# Patient Record
Sex: Male | Born: 1940 | Race: White | Hispanic: No | Marital: Married | Smoking: Never smoker
Health system: Southern US, Community
[De-identification: ages and names within clinical notes are randomized; demographics above are authoritative.]

## PROBLEM LIST (undated history)

## (undated) DIAGNOSIS — K219 Gastro-esophageal reflux disease without esophagitis: Secondary | ICD-10-CM

## (undated) DIAGNOSIS — F419 Anxiety disorder, unspecified: Secondary | ICD-10-CM

## (undated) DIAGNOSIS — K222 Esophageal obstruction: Secondary | ICD-10-CM

## (undated) DIAGNOSIS — G2581 Restless legs syndrome: Secondary | ICD-10-CM

## (undated) DIAGNOSIS — K4090 Unilateral inguinal hernia, without obstruction or gangrene, not specified as recurrent: Secondary | ICD-10-CM

## (undated) DIAGNOSIS — T7840XA Allergy, unspecified, initial encounter: Secondary | ICD-10-CM

## (undated) DIAGNOSIS — K602 Anal fissure, unspecified: Secondary | ICD-10-CM

## (undated) DIAGNOSIS — K5732 Diverticulitis of large intestine without perforation or abscess without bleeding: Secondary | ICD-10-CM

## (undated) DIAGNOSIS — C61 Malignant neoplasm of prostate: Secondary | ICD-10-CM

## (undated) DIAGNOSIS — C801 Malignant (primary) neoplasm, unspecified: Secondary | ICD-10-CM

## (undated) DIAGNOSIS — H8109 Meniere's disease, unspecified ear: Secondary | ICD-10-CM

## (undated) HISTORY — DX: Malignant neoplasm of prostate: C61

## (undated) HISTORY — PX: HERNIA REPAIR: SHX51

## (undated) HISTORY — DX: Anxiety disorder, unspecified: F41.9

## (undated) HISTORY — DX: Allergy, unspecified, initial encounter: T78.40XA

---

## 2000-08-03 HISTORY — PX: PROSTATECTOMY: SHX69

## 2002-08-03 HISTORY — PX: ANAL FISSURE REPAIR: SHX2312

## 2008-04-13 ENCOUNTER — Emergency Department: Payer: Self-pay | Admitting: Emergency Medicine

## 2009-05-06 ENCOUNTER — Ambulatory Visit: Payer: Self-pay | Admitting: Internal Medicine

## 2009-12-23 ENCOUNTER — Ambulatory Visit: Payer: Self-pay | Admitting: Unknown Physician Specialty

## 2011-06-11 ENCOUNTER — Ambulatory Visit: Payer: Self-pay | Admitting: Internal Medicine

## 2012-08-18 ENCOUNTER — Ambulatory Visit: Payer: Self-pay | Admitting: Unknown Physician Specialty

## 2012-08-18 ENCOUNTER — Inpatient Hospital Stay: Payer: Self-pay | Admitting: Internal Medicine

## 2012-08-18 LAB — BASIC METABOLIC PANEL
BUN: 11 mg/dL (ref 7–18)
Calcium, Total: 8.7 mg/dL (ref 8.5–10.1)
Chloride: 102 mmol/L (ref 98–107)
Co2: 27 mmol/L (ref 21–32)
Creatinine: 0.94 mg/dL (ref 0.60–1.30)
EGFR (African American): >60
Glucose: 170 mg/dL — ABNORMAL HIGH (ref 65–99)
Potassium: 3.5 mmol/L (ref 3.5–5.1)
Sodium: 136 mmol/L (ref 136–145)

## 2012-08-18 LAB — CBC WITH DIFFERENTIAL/PLATELET
Basophil #: 0.1 10*3/uL (ref 0.0–0.1)
Basophil %: 1.2 %
Eosinophil %: 0.1 %
HCT: 45.8 % (ref 40.0–52.0)
HGB: 14.5 g/dL (ref 13.0–18.0)
Lymphocyte %: 2.1 %
MCH: 27.6 pg (ref 26.0–34.0)
MCHC: 31.7 g/dL — ABNORMAL LOW (ref 32.0–36.0)
MCV: 87 fL (ref 80–100)
Monocyte #: 0.5 x10 3/mm (ref 0.2–1.0)
Monocyte %: 4.2 %
Neutrophil #: 11.2 10*3/uL — ABNORMAL HIGH (ref 1.4–6.5)
Neutrophil %: 92.4 %
RBC: 5.25 10*6/uL (ref 4.40–5.90)
RDW: 13 % (ref 11.5–14.5)
WBC: 12.1 10*3/uL — ABNORMAL HIGH (ref 3.8–10.6)

## 2012-08-18 LAB — URINALYSIS, COMPLETE
Bilirubin,UR: NEGATIVE
Blood: NEGATIVE
Glucose,UR: NEGATIVE mg/dL (ref 0–75)
Ketone: NEGATIVE
Leukocyte Esterase: NEGATIVE
RBC,UR: NONE SEEN /HPF (ref 0–5)
Specific Gravity: 1.016 (ref 1.003–1.030)

## 2012-08-18 LAB — APTT: Activated PTT: 26.9 secs (ref 23.6–35.9)

## 2012-08-18 LAB — HEPATIC FUNCTION PANEL A (ARMC)
Bilirubin,Total: 0.8 mg/dL (ref 0.2–1.0)
SGOT(AST): 25 U/L (ref 15–37)
SGPT (ALT): 28 U/L (ref 12–78)
Total Protein: 7.4 g/dL (ref 6.4–8.2)

## 2012-08-18 LAB — PROTIME-INR
INR: 0.9
Prothrombin Time: 13 secs (ref 11.5–14.7)

## 2012-08-19 LAB — PATHOLOGY REPORT

## 2012-08-21 LAB — EXPECTORATED SPUTUM ASSESSMENT W GRAM STAIN, RFLX TO RESP C

## 2012-08-24 LAB — CULTURE, BLOOD (SINGLE)

## 2014-05-02 ENCOUNTER — Ambulatory Visit: Payer: Self-pay | Admitting: Otolaryngology

## 2014-11-23 NOTE — H&P (Signed)
PATIENT NAME:  Frank Gonzalez, Frank Gonzalez MR#:  [id number] DATE OF BIRTH:  1941-01-02  DATE OF ADMISSION:  08/18/2012  PRIMARY CARE PHYSICIAN: Rusty Aus, MD  CHIEF COMPLAINT: Cough, fever and chills.   HISTORY OF PRESENT ILLNESS: This is a 74 year old man who came in today for an elective colonoscopy by Dr. Vira Agar. He felt well prior to the procedure. He had a couple of polyps removed. He was coughing during the procedure and probably aspirated. Since his oxygen level and vitals were okay prior to discharge, he was discharged home. On the way home, he was having some chills, developed a fever. He was nauseated. He was having cough, light phlegm, and some chest discomfort with the cough. He came back to the endoscopy suite and was set up for a direct admission for aspiration pneumonia.   PAST MEDICAL HISTORY: Gastroesophageal reflux disease, constipation, history of prostate cancer, restless leg syndrome, anxiety, depression.   PAST SURGICAL HISTORY: Prostatectomy, hernia surgery, rectal anal surgery x 2.   ALLERGIES: No known drug allergies but does have constipation to ranitidine.  MEDICATIONS: As the patient states are Mirapex 1 mg at bedtime, Zoloft 25 mg b.i.d., multivitamin daily, magnesium supplementation, omega-3 daily, 50+ vitamin, ranitidine and TUMS.   SOCIAL HISTORY: No smoking. No alcohol. No drug use. 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   FAMILY HISTORY: Father died of Parkinson's. Mother died at age 50 after childbirth. Three sisters, 1 with ovarian cancer.   REVIEW OF SYSTEMS:  CONSTITUTIONAL: Positive for fever. Positive for chills. Positive for sweating. Positive for fatigue.  EYES: He does wear glasses.  EARS, NOSE, MOUTH AND THROAT: Positive for sore throat. No difficulty swallowing.  CARDIOVASCULAR: Positive for chest pain with cough.  RESPIRATORY: No shortness of breath. Positive for cough, light-colored phlegm. No hemoptysis.  GASTROINTESTINAL:  Positive for nausea. No vomiting. No abdominal pain. No diarrhea. No constipation. No bright red blood per rectum. No melena.  GENITOURINARY: No burning on urination. No hematuria.  MUSCULOSKELETAL: Positive for joint pain.  NEUROLOGIC: No fainting or blackouts.  PSYCHIATRIC: Positive for anxiety and depression.  ENDOCRINE: No thyroid problems.  HEMATOLOGIC, LYMPHATIC: No anemia. No easy bruising or bleeding.   PHYSICAL EXAMINATION:  VITAL SIGNS: Temperature 98.3, pulse 116, respirations 20, blood pressure 128/76, pulse ox 98% on 2 liters.  GENERAL: No respiratory distress, but coughs with deep breath. EYES: Conjunctivae and lids normal. Pupils equal, round and reactive to light. Extraocular muscles intact. No nystagmus.  EARS, NOSE, MOUTH AND THROAT: Tympanic membranes: No erythema. Nasal mucosa: No erythema. Throat: No erythema, no exudate seen. Lips and gums: No lesions.  NECK: No JVD. No bruits. No lymphadenopathy. No thyromegaly. No thyroid nodules palpated.  RESPIRATORY: No use of accessory muscles to breathe. Coughs with deep breath. Positive rhonchi bilateral bases, left greater than right.  CARDIOVASCULAR: S1, S2 tachycardic. No gallops, rubs or murmurs heard. Carotid upstroke 2+ bilaterally, no bruits.  EXTREMITIES: Dorsalis pedis pulses 2+ bilaterally. No edema of the lower extremities.  ABDOMEN: Soft, nontender. No organosplenomegaly. Normoactive bowel sounds. No masses felt.  LYMPHATIC: No lymph nodes in the neck.  MUSCULOSKELETAL: No clubbing, edema or cyanosis.  SKIN: No rashes or ulcers seen.  NEUROLOGIC: Cranial nerves II through XII grossly intact. Deep tendon reflexes 2+ bilateral lower extremities.  PSYCHIATRIC: The patient is oriented to person, place and time.   LABORATORY, DIAGNOSTIC AND RADIOLOGICAL DATA: Blood cultures x 2, CBC, BMP, PT and hepatic panel ordered. Chest x-ray PA  and lateral ordered and an EKG ordered.   ASSESSMENT AND PLAN:  1.  Aspiration pneumonia  with tachycardia: Will get blood cultures x 2, admission labs. Need to watch temperature curve. Empiric Zosyn started. Will also send off a sputum culture and get the chest x-ray.  2.  Anxiety and depression: On Zoloft.  3.  Restless leg syndrome: On Mirapex.  4.  History of prostate cancer treated with prostatectomy.  5.  Status post polyp removal. Continue to monitor closely. Dr. Vira Agar to follow while in the hospital.   TIME SPENT ON ADMISSION: 55 minutes.   CODE STATUS: The patient is a full code.   ____________________________ Tana Conch. Leslye Peer, MD rjw:jm D: 08/18/2012 17:34:50 ET T: 08/18/2012 18:17:45 ET JOB#: [id number]  cc: Tana Conch. Leslye Peer, MD, <Dictator> Rusty Aus, MD Marisue Brooklyn MD ELECTRONICALLY SIGNED 08/18/2012 20:18

## 2014-11-23 NOTE — Discharge Summary (Signed)
PATIENT NAME:  Frank Gonzalez, Frank Gonzalez MR#:  [id number] DATE OF BIRTH:  [birth year]  DATE OF ADMISSION:  08/18/2012 DATE OF DISCHARGE:  08/19/2012  DISCHARGE DIAGNOSES: 1. Aspiration pneumonitis.  2. Restless leg syndrome.  3. Gastroesophageal reflux disease.   DISCHARGE MEDICATIONS: 1. Zoloft 25 mg daily.  2. Mirapex 0.5 mg daily.  3. Advair 100/50 2 times a day. 4. Levaquin 500 mg daily for 10 days.  5. Ranitidine 300 mg twice daily.   REASON FOR ADMISSION: A 74 year old who presents with left lung aspiration pneumonitis post colonoscopy. Please see H and P for HPI, past medical history and physical exam.   HOSPITAL COURSE: The patient was admitted with chest x-ray showing inflammation of the left lung, but no clear infiltrate. He received IV Zosyn. His fever went away. His cough improved. He came off oxygen, 95% on room air. He will be instructed in the use of Advair. He will be switched over to Levaquin and continue that for 10 days. His reflux was controlled with 300 mg ranitidine. Follow up with Dr. Sabra Heck mid next week. His prognosis is good.  ____________________________ Rusty Aus, MD mfm:sb D: 08/19/2012 08:21:04 ET T: 08/19/2012 08:39:42 ET JOB#: [id number]  cc: Rusty Aus, MD, <Dictator> Rusty Aus MD ELECTRONICALLY SIGNED 08/22/2012 8:01

## 2015-08-23 DIAGNOSIS — Z86010 Personal history of colonic polyps: Secondary | ICD-10-CM | POA: Diagnosis not present

## 2015-09-24 ENCOUNTER — Encounter: Payer: Self-pay | Admitting: *Deleted

## 2015-09-25 ENCOUNTER — Ambulatory Visit: Payer: PPO | Admitting: Anesthesiology

## 2015-09-25 ENCOUNTER — Encounter
Admission: RE | Disposition: A | Discharge status: Home / Self Care | Payer: Self-pay | Source: Ambulatory Visit | Attending: Unknown Physician Specialty

## 2015-09-25 ENCOUNTER — Encounter: Payer: Self-pay | Admitting: Anesthesiology

## 2015-09-25 ENCOUNTER — Ambulatory Visit
Admission: RE | Admit: 2015-09-25 | Discharge: 2015-09-25 | Disposition: A | Discharge status: Home / Self Care | Payer: PPO | Source: Ambulatory Visit | Attending: Unknown Physician Specialty | Admitting: Unknown Physician Specialty

## 2015-09-25 DIAGNOSIS — Z09 Encounter for follow-up examination after completed treatment for conditions other than malignant neoplasm: Secondary | ICD-10-CM | POA: Insufficient documentation

## 2015-09-25 DIAGNOSIS — Z79899 Other long term (current) drug therapy: Secondary | ICD-10-CM | POA: Insufficient documentation

## 2015-09-25 DIAGNOSIS — Z86010 Personal history of colonic polyps: Secondary | ICD-10-CM | POA: Diagnosis not present

## 2015-09-25 DIAGNOSIS — Z9079 Acquired absence of other genital organ(s): Secondary | ICD-10-CM | POA: Diagnosis not present

## 2015-09-25 DIAGNOSIS — K648 Other hemorrhoids: Secondary | ICD-10-CM | POA: Diagnosis not present

## 2015-09-25 DIAGNOSIS — Z8546 Personal history of malignant neoplasm of prostate: Secondary | ICD-10-CM | POA: Insufficient documentation

## 2015-09-25 DIAGNOSIS — Z9889 Other specified postprocedural states: Secondary | ICD-10-CM | POA: Insufficient documentation

## 2015-09-25 DIAGNOSIS — K219 Gastro-esophageal reflux disease without esophagitis: Secondary | ICD-10-CM | POA: Insufficient documentation

## 2015-09-25 DIAGNOSIS — G2581 Restless legs syndrome: Secondary | ICD-10-CM | POA: Diagnosis not present

## 2015-09-25 DIAGNOSIS — K640 First degree hemorrhoids: Secondary | ICD-10-CM | POA: Diagnosis not present

## 2015-09-25 HISTORY — DX: Meniere's disease, unspecified ear: H81.09

## 2015-09-25 HISTORY — DX: Anal fissure, unspecified: K60.2

## 2015-09-25 HISTORY — DX: Malignant (primary) neoplasm, unspecified: C80.1

## 2015-09-25 HISTORY — DX: Gastro-esophageal reflux disease without esophagitis: K21.9

## 2015-09-25 HISTORY — PX: COLONOSCOPY WITH PROPOFOL: SHX5780

## 2015-09-25 HISTORY — DX: Diverticulitis of large intestine without perforation or abscess without bleeding: K57.32

## 2015-09-25 HISTORY — DX: Esophageal obstruction: K22.2

## 2015-09-25 HISTORY — DX: Restless legs syndrome: G25.81

## 2015-09-25 HISTORY — DX: Unilateral inguinal hernia, without obstruction or gangrene, not specified as recurrent: K40.90

## 2015-09-25 SURGERY — COLONOSCOPY WITH PROPOFOL
Anesthesia: General

## 2015-09-25 MED ORDER — METOCLOPRAMIDE HCL 5 MG/ML IJ SOLN
INTRAMUSCULAR | Status: DC | PRN
  Administered 2015-09-25: 10 mg via INTRAVENOUS

## 2015-09-25 MED ORDER — SODIUM CHLORIDE 0.9 % IV SOLN
INTRAVENOUS | Status: DC
  Administered 2015-09-25: 1000 mL via INTRAVENOUS

## 2015-09-25 MED ORDER — MIDAZOLAM HCL 2 MG/2ML IJ SOLN
INTRAMUSCULAR | Status: DC | PRN
  Administered 2015-09-25: 1 mg via INTRAVENOUS

## 2015-09-25 MED ORDER — FENTANYL CITRATE (PF) 100 MCG/2ML IJ SOLN
INTRAMUSCULAR | Status: DC | PRN
Start: 2015-09-25 — End: 2015-09-25
  Administered 2015-09-25: 50 ug via INTRAVENOUS

## 2015-09-25 MED ORDER — SODIUM CHLORIDE 0.9 % IV SOLN: INTRAVENOUS | Status: DC

## 2015-09-25 MED ORDER — PROPOFOL 500 MG/50ML IV EMUL
INTRAVENOUS | Status: DC | PRN
  Administered 2015-09-25: 75 ug/kg/min via INTRAVENOUS

## 2015-09-25 MED ORDER — PROPOFOL 10 MG/ML IV BOLUS
INTRAVENOUS | Status: DC | PRN
  Administered 2015-09-25: 10 mg via INTRAVENOUS
  Administered 2015-09-25: 20 mg via INTRAVENOUS

## 2015-09-25 NOTE — H&P (Signed)
Primary Care Physician:  Rusty Aus., MD Primary Gastroenterologist:  Dr. Vira Agar  Pre-Procedure History & Physical: HPI:  Frank Gonzalez is a 75 y.o. male is here for an colonoscopy.   Past Medical History  Diagnosis Date  . Anal fissure   . Diverticulitis large intestine   . GERD (gastroesophageal reflux disease)   . Inguinal hernia   . Meniere disease   . Cancer [facility])     prostate  . RLS (restless legs syndrome)   . Schatzki's ring     Past Surgical History  Procedure Laterality Date  . Hernia repair    . Prostatectomy  2002  . Anal fissure repair  2004    Prior to Admission medications   Medication Sig Start Date End Date Taking? Authorizing Provider  beta carotene w/minerals (OCUVITE) tablet Take 1 tablet by mouth daily.   Yes Historical Provider, MD  Cholecalciferol (VITAMIN D3) 3000 units TABS Take 2,000 Units by mouth daily.   Yes Historical Provider, MD  Multiple Vitamin (MULTIVITAMIN) tablet Take 1 tablet by mouth daily.   Yes Historical Provider, MD  olopatadine (PATANOL) 0.1 % ophthalmic solution Place 2 drops into both eyes 2 (two) times daily.   Yes Historical Provider, MD  omega-3 acid ethyl esters (LOVAZA) 1 g capsule Take 1,200 mg by mouth daily.   Yes Historical Provider, MD  pantoprazole (PROTONIX) 40 MG tablet Take 40 mg by mouth daily.   Yes Historical Provider, MD  polycarbophil (FIBERCON) 625 MG tablet Take 625 mg by mouth daily.   Yes Historical Provider, MD  pramipexole (MIRAPEX) 1 MG tablet Take 1 mg by mouth daily.   Yes Historical Provider, MD  senna-docusate (SENOKOT-S) 8.6-50 MG tablet Take 1 tablet by mouth daily.   Yes Historical Provider, MD  sertraline (ZOLOFT) 50 MG tablet Take 50 mg by mouth daily.   Yes Historical Provider, MD  Specialty Vitamins Products (MAGNESIUM, AMINO ACID CHELATE,) 133 MG tablet Take 1 tablet by mouth 2 (two) times daily.   Yes Historical Provider, MD  triamcinolone (NASACORT ALLERGY 24HR) 55 MCG/ACT AERO nasal  inhaler Place 2 sprays into the nose daily.   Yes Historical Provider, MD    Allergies as of 09/03/2015  . (Not on File)    History reviewed. No pertinent family history.  Social History   Social History  . Marital Status: Married    Spouse Name: N/A  . Number of Children: N/A  . Years of Education: N/A   Occupational History  . Not on file.   Social History Main Topics  . Smoking status: Never Smoker   . Smokeless tobacco: Never Used  . Alcohol Use: No  . Drug Use: No  . Sexual Activity: Not on file   Other Topics Concern  . Not on file   Social History Narrative    Review of Systems: See HPI, otherwise negative ROS  Physical Exam: BP 138/78 mmHg  Pulse 64  Temp(Src) 96.9 F (36.1 C) (Tympanic)  Resp 16  Ht 6' (1.829 m)  Wt 77.111 kg (170 lb)  BMI 23.05 kg/m2  SpO2 100% General:   Alert,  pleasant and cooperative in NAD Head:  Normocephalic and atraumatic. Neck:  Supple; no masses or thyromegaly. Lungs:  Clear throughout to auscultation.    Heart:  Regular rate and rhythm. Abdomen:  Soft, nontender and nondistended. Normal bowel sounds, without guarding, and without rebound.   Neurologic:  Alert and  oriented x4;  grossly normal neurologically.  Impression/Plan: Lewie Loron  is here for an colonoscopy to be performed for [facility] colon polyps  Risks, benefits, limitations, and alternatives regarding  colonoscopy have been reviewed with the patient.  Questions have been answered.  All parties agreeable.   Gaylyn Cheers, MD  09/25/2015, 8:38 AM

## 2015-09-25 NOTE — Anesthesia Preprocedure Evaluation (Signed)
Anesthesia Evaluation  Patient identified by MRN, date of birth, ID band Patient awake    Reviewed: Allergy & Precautions, H&P , NPO status , Patient's Chart, lab work & pertinent test results, reviewed documented beta blocker date and time   History of Anesthesia Complications Negative for: history of anesthetic complications  Airway Mallampati: III  TM Distance: >3 FB Neck ROM: full    Dental no notable dental hx. (+) Caps, Missing   Pulmonary neg pulmonary ROS,    Pulmonary exam normal breath sounds clear to auscultation       Cardiovascular Exercise Tolerance: Good negative cardio ROS Normal cardiovascular exam Rhythm:regular Rate:Normal     Neuro/Psych negative neurological ROS  negative psych ROS   GI/Hepatic Neg liver ROS, GERD  ,  Endo/Other  negative endocrine ROS  Renal/GU negative Renal ROS  negative genitourinary   Musculoskeletal   Abdominal   Peds  Hematology negative hematology ROS (+)   Anesthesia Other Findings Past Medical History:   Anal fissure                                                 Diverticulitis large intestine                               GERD (gastroesophageal reflux disease)                       Inguinal hernia                                              Meniere disease                                              Cancer (HCC)                                                   Comment:prostate   RLS (restless legs syndrome)                                 Schatzki's ring                                              Reproductive/Obstetrics negative OB ROS                             Anesthesia Physical Anesthesia Plan  ASA: II  Anesthesia Plan: General   Post-op Pain Management:    Induction:   Airway Management Planned:   Additional Equipment:   Intra-op Plan:   Post-operative Plan:   Informed Consent: I have reviewed the patients  History and Physical, chart, labs and discussed the procedure  including the risks, benefits and alternatives for the proposed anesthesia with the patient or authorized representative who has indicated his/her understanding and acceptance.   Dental Advisory Given  Plan Discussed with: Anesthesiologist, CRNA and Surgeon  Anesthesia Plan Comments:         Anesthesia Quick Evaluation

## 2015-09-25 NOTE — Op Note (Signed)
[facility] Gastroenterology Patient Name: Frank Gonzalez Procedure Date: 09/25/2015 8:43 AM MRN: [id number] Account #: [id number] Date of Birth: [birth year] Admit Type: Outpatient Age: 75 Room: [facility] ENDO ROOM 4 Gender: Male Note Status: Finalized Procedure:            Colonoscopy Indications:          High risk colon cancer surveillance: Personal history                        of colonic polyps Providers:            Manya Silvas, MD Referring MD:         Rusty Aus, MD (Referring MD) Medicines:            Propofol per Anesthesia Complications:        No immediate complications. Procedure:            Pre-Anesthesia Assessment:                       - After reviewing the risks and benefits, the patient                        was deemed in satisfactory condition to undergo the                        procedure.                       After obtaining informed consent, the colonoscope was                        passed under direct vision. Throughout the procedure,                        the patient's blood pressure, pulse, and oxygen                        saturations were monitored continuously. The                        Colonoscope was introduced through the anus and                        advanced to the the cecum, identified by appendiceal                        orifice and ileocecal valve. The colonoscopy was                        performed without difficulty. The patient tolerated the                        procedure well. The quality of the bowel preparation                        was excellent. Findings:      Internal hemorrhoids were found during endoscopy. The hemorrhoids were       small and Grade I (internal hemorrhoids that do not prolapse).      The exam was otherwise without abnormality. Impression:           -  Internal hemorrhoids.                       - The examination was otherwise normal.                       - No specimens  collected. Recommendation:       - Repeat colonoscopy in 5 years for surveillance. Manya Silvas, MD 09/25/2015 9:22:04 AM This report has been signed electronically. Number of Addenda: 0 Note Initiated On: 09/25/2015 8:43 AM Scope Withdrawal Time: 0 hours 9 minutes 5 seconds  Total Procedure Duration: 0 hours 17 minutes 1 second       [facility]

## 2015-09-25 NOTE — Transfer of Care (Signed)
Immediate Anesthesia Transfer of Care Note  Patient: Markhi Kindred  Procedure(s) Performed: Procedure(s): COLONOSCOPY WITH PROPOFOL (N/A)  Patient Location: PACU  Anesthesia Type:General  Level of Consciousness: awake, alert  and oriented  Airway & Oxygen Therapy: Patient Spontanous Breathing and Patient connected to nasal cannula oxygen  Post-op Assessment: Report given to RN and Post -op Vital signs reviewed and stable  Post vital signs: Reviewed and stable  Last Vitals:  Filed Vitals:   09/25/15 0736  BP: 138/78  Pulse: 64  Temp: 36.1 C  Resp: 16    Complications: No apparent anesthesia complications

## 2015-09-25 NOTE — Anesthesia Postprocedure Evaluation (Signed)
Anesthesia Post Note  Patient: Frank Gonzalez  Procedure(s) Performed: Procedure(s) (LRB): COLONOSCOPY WITH PROPOFOL (N/A)  Patient location during evaluation: Endoscopy Anesthesia Type: General Level of consciousness: awake and alert Pain management: pain level controlled Vital Signs Assessment: post-procedure vital signs reviewed and stable Respiratory status: spontaneous breathing, nonlabored ventilation, respiratory function stable and patient connected to nasal cannula oxygen Cardiovascular status: blood pressure returned to baseline and stable Postop Assessment: no signs of nausea or vomiting Anesthetic complications: no    Last Vitals:  Filed Vitals:   09/25/15 0935 09/25/15 0945  BP: 116/71 126/96  Pulse: 67   Temp:    Resp: 15     Last Pain: There were no vitals filed for this visit.               Martha Clan

## 2015-10-22 DIAGNOSIS — K5792 Diverticulitis of intestine, part unspecified, without perforation or abscess without bleeding: Secondary | ICD-10-CM | POA: Diagnosis not present

## 2015-12-25 DIAGNOSIS — H2513 Age-related nuclear cataract, bilateral: Secondary | ICD-10-CM | POA: Diagnosis not present

## 2016-01-14 DIAGNOSIS — R42 Dizziness and giddiness: Secondary | ICD-10-CM | POA: Diagnosis not present

## 2016-01-21 DIAGNOSIS — J301 Allergic rhinitis due to pollen: Secondary | ICD-10-CM | POA: Diagnosis not present

## 2016-01-21 DIAGNOSIS — H8109 Meniere's disease, unspecified ear: Secondary | ICD-10-CM | POA: Diagnosis not present

## 2016-01-21 DIAGNOSIS — H6123 Impacted cerumen, bilateral: Secondary | ICD-10-CM | POA: Diagnosis not present

## 2016-03-19 DIAGNOSIS — D229 Melanocytic nevi, unspecified: Secondary | ICD-10-CM | POA: Diagnosis not present

## 2016-04-15 DIAGNOSIS — D3611 Benign neoplasm of peripheral nerves and autonomic nervous system of face, head, and neck: Secondary | ICD-10-CM | POA: Diagnosis not present

## 2016-04-15 DIAGNOSIS — L821 Other seborrheic keratosis: Secondary | ICD-10-CM | POA: Diagnosis not present

## 2016-04-15 DIAGNOSIS — L820 Inflamed seborrheic keratosis: Secondary | ICD-10-CM | POA: Diagnosis not present

## 2016-04-15 DIAGNOSIS — D2272 Melanocytic nevi of left lower limb, including hip: Secondary | ICD-10-CM | POA: Diagnosis not present

## 2016-04-15 DIAGNOSIS — D2261 Melanocytic nevi of right upper limb, including shoulder: Secondary | ICD-10-CM | POA: Diagnosis not present

## 2016-04-15 DIAGNOSIS — D225 Melanocytic nevi of trunk: Secondary | ICD-10-CM | POA: Diagnosis not present

## 2016-04-15 DIAGNOSIS — D485 Neoplasm of uncertain behavior of skin: Secondary | ICD-10-CM | POA: Diagnosis not present

## 2016-04-15 DIAGNOSIS — X32XXXA Exposure to sunlight, initial encounter: Secondary | ICD-10-CM | POA: Diagnosis not present

## 2016-04-15 DIAGNOSIS — L570 Actinic keratosis: Secondary | ICD-10-CM | POA: Diagnosis not present

## 2016-05-11 DIAGNOSIS — Z125 Encounter for screening for malignant neoplasm of prostate: Secondary | ICD-10-CM | POA: Diagnosis not present

## 2016-05-11 DIAGNOSIS — Z0000 Encounter for general adult medical examination without abnormal findings: Secondary | ICD-10-CM | POA: Diagnosis not present

## 2016-05-18 DIAGNOSIS — Z23 Encounter for immunization: Secondary | ICD-10-CM | POA: Diagnosis not present

## 2016-05-18 DIAGNOSIS — Z0000 Encounter for general adult medical examination without abnormal findings: Secondary | ICD-10-CM | POA: Diagnosis not present

## 2016-08-05 DIAGNOSIS — S0512XA Contusion of eyeball and orbital tissues, left eye, initial encounter: Secondary | ICD-10-CM | POA: Diagnosis not present

## 2016-08-12 DIAGNOSIS — M170 Bilateral primary osteoarthritis of knee: Secondary | ICD-10-CM | POA: Diagnosis not present

## 2016-08-26 ENCOUNTER — Ambulatory Visit: Payer: PPO | Attending: Internal Medicine | Admitting: Physical Therapy

## 2016-08-26 ENCOUNTER — Encounter: Payer: Self-pay | Admitting: Physical Therapy

## 2016-08-26 DIAGNOSIS — M2560 Stiffness of unspecified joint, not elsewhere classified: Secondary | ICD-10-CM | POA: Insufficient documentation

## 2016-08-26 DIAGNOSIS — M6281 Muscle weakness (generalized): Secondary | ICD-10-CM | POA: Diagnosis not present

## 2016-08-26 DIAGNOSIS — M542 Cervicalgia: Secondary | ICD-10-CM | POA: Diagnosis not present

## 2016-08-26 DIAGNOSIS — R293 Abnormal posture: Secondary | ICD-10-CM | POA: Insufficient documentation

## 2016-08-27 NOTE — Therapy (Addendum)
[place] [facility] [address] [place], [place], [address] Phone: [phone]   Fax:  [phone]  Physical Therapy Evaluation  Patient Details  Name: Frank Gonzalez MRN: [id number] Date of Birth: 06/02/1941 Referring Provider: Emily Filbert, MD  Encounter Date: 08/26/2016  1 of 8 treatment session.  End of cert: [id number].  Past Medical History:  Diagnosis Date  . Anal fissure   . Cancer [facility])    prostate  . Diverticulitis large intestine   . GERD (gastroesophageal reflux disease)   . Inguinal hernia   . Meniere disease   . RLS (restless legs syndrome)   . Schatzki's ring     Past Surgical History:  Procedure Laterality Date  . ANAL FISSURE REPAIR  2004  . COLONOSCOPY WITH PROPOFOL N/A 09/25/2015   Procedure: COLONOSCOPY WITH PROPOFOL;  Surgeon: Manya Silvas, MD;  Location: [facility] ENDOSCOPY;  Service: Endoscopy;  Laterality: N/A;  . HERNIA REPAIR    . PROSTATECTOMY  2002    There were no vitals filed for this visit.        [facility] PT Assessment - 09/22/16 0001      Assessment   Medical Diagnosis Neck pain   Referring Provider Emily Filbert, MD   Onset Date/Surgical Date 08/03/16   Prior Therapy no       Pt. report chronic h/o L side neck pain with neck rotn./ occasional radicular symptoms.  Pt. reports Meniere's diseaese symptoms with head position changes.     See HEP/ handouts.      Pt. is a pleasant 76 y/o male with chronic h/o neck pain with recent exacerbation of symptoms in morning.  Pt. reports 0-1/10 neck pain currently at rest and remains active with gym based ex. program for strengthening/ cardio.  Pt. presents with moderate thoracic kyphosis/ forward head posture and limited c-spine ROM.  Seated neck AROM: flexion (58 deg.), ext. (18 deg.), L rotn. (64 deg.), R rotn. (54 deg.), L lat. flex. (27 deg.), R lat. flex. (27 deg.).  B UE AROM WFL and strength grossly 5/5 MMT.  Grip strength: L (74.1#), R (77.5#).   Increase neck pain with cervical rotn. at end-range.   Difficutly with supine/ seated chin tucks due to forward head posture.  Pt. prefers cervical extension position during chin tucks.  NDI: 16% self-perceived mild disability.  No radicular symptoms during tx. session.  Pt. will benefit from short-term skilled PT services to increase cervical AROM/ upright posture to progress to gym based ex. with personal trainer.        PT Long Term Goals - 08/27/16 1113      PT LONG TERM GOAL #1   Title Pt. I with HEP to increase cervical AROM to [facility] with no c/o radicular symptoms to improve pain-free mobility.     Baseline Seated neck AROM: flexion (58 deg.), ext. (18 deg.), L rotn. (64 deg.), R rotn. (54 deg.), L lat. flex. (27 deg.), R lat. flex. (27 deg.).  B UE AROM WFL and strength grossly 5/5 MMT.  Grip strength: L (74.1#), R (77.5#).   Time 4   Period Weeks   Status New     PT LONG TERM GOAL #2   Title Pt. able to correct upright posture/ head position with no c/o neck pain to improve daily tasks/ sleeping posture.    Baseline Moderate forward head/ round sh./ thoracic kyphotic posture.     Time 4   Period Weeks   Status New  PT LONG TERM GOAL #3   Title Pt. will demonstrate independence with exercise program and progress to personal trainer at [employer] with [employer].    Baseline no personal trainer at this time.    Time 4   Period Weeks   Status New     PT LONG TERM GOAL #4   Title Pt. will decrease NDI to <10% to improve pain-free mobility.     Baseline 1/24: NDI is 16% self-perceived mild disability.    Time 4   Period Weeks   Status New       Patient will benefit from skilled therapeutic intervention in order to improve the following deficits and impairments:  Improper body mechanics, Postural dysfunction, Decreased mobility, Decreased range of motion, Decreased strength, Hypomobility, Impaired flexibility  Visit Diagnosis: Abnormal posture  Joint stiffness  Muscle  weakness (generalized)  Neck pain     Problem List There are no active problems to display for this patient.  Pura Spice, PT, DPT # [phone] 08/27/16, 11:19 AM  [facility] [address] [place], [place], [address] Phone: [phone]   Fax:  [phone]  Name: Frank Gonzalez MRN: [id number] Date of Birth: [birth year]

## 2016-09-03 ENCOUNTER — Ambulatory Visit: Payer: PPO | Attending: Internal Medicine | Admitting: Physical Therapy

## 2016-09-03 DIAGNOSIS — M6281 Muscle weakness (generalized): Secondary | ICD-10-CM | POA: Insufficient documentation

## 2016-09-03 DIAGNOSIS — M2560 Stiffness of unspecified joint, not elsewhere classified: Secondary | ICD-10-CM | POA: Insufficient documentation

## 2016-09-03 DIAGNOSIS — R293 Abnormal posture: Secondary | ICD-10-CM | POA: Insufficient documentation

## 2016-09-03 DIAGNOSIS — M542 Cervicalgia: Secondary | ICD-10-CM | POA: Insufficient documentation

## 2016-09-04 NOTE — Therapy (Addendum)
[place] [facility] [address] [place], [place], [address] Phone: [phone]   Fax:  [phone]  Physical Therapy Treatment  Patient Details  Name: Frank Gonzalez MRN: [id number] Date of Birth: February 17, 1941 No Data Recorded  Encounter Date: 09/03/2016      PT End of Session - 09/04/16 1537    Visit Number 2   Number of Visits 8   Date for PT Re-Evaluation 09/23/16   Authorization - Visit Number 2   Authorization - Number of Visits 10   PT Start Time 0805   PT Stop Time [id number]   PT Time Calculation (min) 46 min   Activity Tolerance Patient tolerated treatment well;No increased pain   Behavior During Therapy WFL for tasks assessed/performed      Past Medical History:  Diagnosis Date  . Anal fissure   . Cancer [facility])    prostate  . Diverticulitis large intestine   . GERD (gastroesophageal reflux disease)   . Inguinal hernia   . Meniere disease   . RLS (restless legs syndrome)   . Schatzki's ring     Past Surgical History:  Procedure Laterality Date  . ANAL FISSURE REPAIR  2004  . COLONOSCOPY WITH PROPOFOL N/A 09/25/2015   Procedure: COLONOSCOPY WITH PROPOFOL;  Surgeon: Manya Silvas, MD;  Location[facility] ENDOSCOPY;  Service: Endoscopy;  Laterality: N/A;  . HERNIA REPAIR    . PROSTATECTOMY  2002    There were no vitals filed for this visit.      Subjective Assessment - 09/04/16 1536    Limitations House hold activities;Standing;Lifting   Patient Stated Goals Decrease neck pain/ improve posture.       Pt. states he went to gym yesterday and did well.  Pt. has questions about chin tucks.     There.ex.:  Reviewed HEP.  Standing doorway stretches to increase pec. Length 4x20 sec. Holds.  Focus on deep neck flexors/ chin tucks with B shoulder AROM.   Manual tx.:  Supine cervical AA/PROM all planes 5x each with holds.  Supine UT/levator manual stretches 3x each.  Cervical traction with holds 5x each.  Prone grade II mobs. To  upper thoracic spine (central/ unilateral) 2x20 sec.      Pt. has difficulty with cervical chin tucks due to preferred extension in neck/ thoracic kyphosis.  Pt. presents with tight B pec muscles in supine/ seated posture.  No increase c/o pain during cervical stretches/ prone manual mobs.  Pt. planning on contacting [place] (personal trainer) to discuss setting up sessions.          Plan - 09/04/16 1539    Rehab Potential Good   PT Frequency 1x / week   PT Duration 4 weeks   PT Treatment/Interventions ADLs/Self Care Home Management;Cryotherapy;Electrical Stimulation;Moist Heat;Functional mobility training;Therapeutic activities;Therapeutic exercise;Patient/family education;Neuromuscular re-education;Manual techniques;Passive range of motion;Dry needling;Energy conservation   PT Next Visit Plan Progress HEP.  Discuss Physiological scientist with Delilah Shan.    Consulted and Agree with Plan of Care Patient      Patient will benefit from skilled therapeutic intervention in order to improve the following deficits and impairments:  Improper body mechanics, Postural dysfunction, Decreased mobility, Decreased range of motion, Decreased strength, Hypomobility, Impaired flexibility  Visit Diagnosis: Abnormal posture  Joint stiffness  Muscle weakness (generalized)  Neck pain     Problem List There are no active problems to display for this patient.  Pura Spice, PT, DPT # [phone] 09/04/2016, 3:41 PM  [facility] [address] [place], [place], [address] Phone: [phone]   Fax:  [phone]  Name: Frank Gonzalez MRN: [id number] Date of Birth: 11-07-1940

## 2016-09-10 ENCOUNTER — Ambulatory Visit: Payer: PPO | Admitting: Physical Therapy

## 2016-09-10 DIAGNOSIS — R293 Abnormal posture: Secondary | ICD-10-CM

## 2016-09-10 DIAGNOSIS — M2560 Stiffness of unspecified joint, not elsewhere classified: Secondary | ICD-10-CM

## 2016-09-10 DIAGNOSIS — M6281 Muscle weakness (generalized): Secondary | ICD-10-CM

## 2016-09-10 DIAGNOSIS — M542 Cervicalgia: Secondary | ICD-10-CM

## 2016-09-11 NOTE — Therapy (Addendum)
[facility] [address] [place], [place], [address] Phone: [phone]   Fax:  [phone]  Physical Therapy Treatment  Patient Details  Name: Frank Gonzalez MRN: [id number] Date of Birth: 08/14/1940 Referring Provider: Emily Filbert, MD  Encounter Date: 09/10/2016  3 of 3 treatment sessions.  Discharge visit.    Past Medical History:  Diagnosis Date  . Anal fissure   . Cancer [facility])    prostate  . Diverticulitis large intestine   . GERD (gastroesophageal reflux disease)   . Inguinal hernia   . Meniere disease   . RLS (restless legs syndrome)   . Schatzki's ring     Past Surgical History:  Procedure Laterality Date  . ANAL FISSURE REPAIR  2004  . COLONOSCOPY WITH PROPOFOL N/A 09/25/2015   Procedure: COLONOSCOPY WITH PROPOFOL;  Surgeon: Manya Silvas, MD;  Location: [facility] ENDOSCOPY;  Service: Endoscopy;  Laterality: N/A;  . HERNIA REPAIR    . PROSTATECTOMY  2002    There were no vitals filed for this visit.        [facility] PT Assessment - 09/22/16 0001      Assessment   Medical Diagnosis Neck pain   Referring Provider Emily Filbert, MD   Onset Date/Surgical Date 08/03/16   Prior Therapy no       Pt. states he is doing well.  Pt. understands HEP.  Pt. has gotten in contact with Delilah Shan and has personal training visit scheduled.        There.ex.:  Reviewed HEP/ gym based ex.  Standing doorway stretches to increase pec. Length 4x20 sec. Holds.  Focus on deep neck flexors/ chin tucks with B shoulder AROM.   Manual tx.:  Supine cervical AA/PROM all planes 5x each with holds.  Supine UT/levator manual stretches 3x each.  Cervical traction with holds 5x each.  Prone grade II mobs. To upper thoracic spine (central/ unilateral) 2x20 sec.   Discussed referral to [facility] for personal training at the [employer].      Pt. is doing well and understands the benefits of a consistent B UE/posture/ cervical stretching program.  Pt.  will benefit from discharge from PT with focus on personal training at local gym.  Pt. instructed to contact PT if any issues or concerns over the next several weeks.         PT Long Term Goals - 09/11/16 1148      PT LONG TERM GOAL #1   Title Pt. I with HEP to increase cervical AROM to [facility] with no c/o radicular symptoms to improve pain-free mobility.     Baseline I with HEP.  Pt. has progressed to personal trainer at this time.    Time 4   Period Weeks   Status Achieved     PT LONG TERM GOAL #2   Title Pt. able to correct upright posture/ head position with no c/o neck pain to improve daily tasks/ sleeping posture.    Baseline Chronic forward head/ kyphotic posture but more aware of posture with daily tasks.     Time 4   Period Weeks   Status Partially Met     PT LONG TERM GOAL #3   Title Pt. will demonstrate independence with exercise program and progress to personal trainer at [employer] with [employer].    Period Weeks   Status Achieved     PT LONG TERM GOAL #4   Title Pt. will decrease NDI to <10% to improve pain-free  mobility.     Baseline 1/24: NDI is 16% self-perceived mild disability.    Time 4   Period Weeks   Status Unable to assess      Patient will benefit from skilled therapeutic intervention in order to improve the following deficits and impairments:  Improper body mechanics, Postural dysfunction, Decreased mobility, Decreased range of motion, Decreased strength, Hypomobility, Impaired flexibility  Visit Diagnosis: Abnormal posture  Joint stiffness  Muscle weakness (generalized)  Neck pain     Problem List There are no active problems to display for this patient.  Pura Spice, PT, DPT # [id number] 09/22/2016, 11:50 AM  [facility] [address] [place], [place], [address] Phone: [phone]   Fax:  [phone]  Name: Frank Gonzalez MRN: [id number] Date of Birth: 07-01-1941

## 2016-09-22 NOTE — Addendum Note (Signed)
Addended by: Dorcas Carrow C on: 09/22/2016 11:22 AM   Modules accepted: Orders

## 2016-12-03 DIAGNOSIS — N342 Other urethritis: Secondary | ICD-10-CM | POA: Diagnosis not present

## 2016-12-03 DIAGNOSIS — N3090 Cystitis, unspecified without hematuria: Secondary | ICD-10-CM | POA: Diagnosis not present

## 2016-12-07 DIAGNOSIS — H43813 Vitreous degeneration, bilateral: Secondary | ICD-10-CM | POA: Diagnosis not present

## 2016-12-23 DIAGNOSIS — L03313 Cellulitis of chest wall: Secondary | ICD-10-CM | POA: Diagnosis not present

## 2017-01-01 DIAGNOSIS — R1032 Left lower quadrant pain: Secondary | ICD-10-CM | POA: Diagnosis not present

## 2017-01-25 DIAGNOSIS — H43813 Vitreous degeneration, bilateral: Secondary | ICD-10-CM | POA: Diagnosis not present

## 2017-04-15 DIAGNOSIS — D2261 Melanocytic nevi of right upper limb, including shoulder: Secondary | ICD-10-CM | POA: Diagnosis not present

## 2017-04-15 DIAGNOSIS — X32XXXA Exposure to sunlight, initial encounter: Secondary | ICD-10-CM | POA: Diagnosis not present

## 2017-04-15 DIAGNOSIS — D2272 Melanocytic nevi of left lower limb, including hip: Secondary | ICD-10-CM | POA: Diagnosis not present

## 2017-04-15 DIAGNOSIS — L570 Actinic keratosis: Secondary | ICD-10-CM | POA: Diagnosis not present

## 2017-04-15 DIAGNOSIS — L821 Other seborrheic keratosis: Secondary | ICD-10-CM | POA: Diagnosis not present

## 2017-04-15 DIAGNOSIS — D225 Melanocytic nevi of trunk: Secondary | ICD-10-CM | POA: Diagnosis not present

## 2017-05-12 DIAGNOSIS — Z0000 Encounter for general adult medical examination without abnormal findings: Secondary | ICD-10-CM | POA: Diagnosis not present

## 2017-05-12 DIAGNOSIS — Z125 Encounter for screening for malignant neoplasm of prostate: Secondary | ICD-10-CM | POA: Diagnosis not present

## 2017-05-19 DIAGNOSIS — Z79899 Other long term (current) drug therapy: Secondary | ICD-10-CM | POA: Diagnosis not present

## 2017-05-19 DIAGNOSIS — K210 Gastro-esophageal reflux disease with esophagitis: Secondary | ICD-10-CM | POA: Diagnosis not present

## 2017-05-19 DIAGNOSIS — Z125 Encounter for screening for malignant neoplasm of prostate: Secondary | ICD-10-CM | POA: Diagnosis not present

## 2017-05-19 DIAGNOSIS — Z0000 Encounter for general adult medical examination without abnormal findings: Secondary | ICD-10-CM | POA: Diagnosis not present

## 2017-08-18 DIAGNOSIS — H43813 Vitreous degeneration, bilateral: Secondary | ICD-10-CM | POA: Diagnosis not present

## 2017-11-26 DIAGNOSIS — N3090 Cystitis, unspecified without hematuria: Secondary | ICD-10-CM | POA: Diagnosis not present

## 2018-01-25 DIAGNOSIS — R21 Rash and other nonspecific skin eruption: Secondary | ICD-10-CM | POA: Diagnosis not present

## 2018-02-18 DIAGNOSIS — M76899 Other specified enthesopathies of unspecified lower limb, excluding foot: Secondary | ICD-10-CM | POA: Diagnosis not present

## 2018-02-18 DIAGNOSIS — L03313 Cellulitis of chest wall: Secondary | ICD-10-CM | POA: Diagnosis not present

## 2018-02-18 DIAGNOSIS — G2581 Restless legs syndrome: Secondary | ICD-10-CM | POA: Diagnosis not present

## 2018-04-14 DIAGNOSIS — D2272 Melanocytic nevi of left lower limb, including hip: Secondary | ICD-10-CM | POA: Diagnosis not present

## 2018-04-14 DIAGNOSIS — L570 Actinic keratosis: Secondary | ICD-10-CM | POA: Diagnosis not present

## 2018-04-14 DIAGNOSIS — X32XXXA Exposure to sunlight, initial encounter: Secondary | ICD-10-CM | POA: Diagnosis not present

## 2018-04-14 DIAGNOSIS — L821 Other seborrheic keratosis: Secondary | ICD-10-CM | POA: Diagnosis not present

## 2018-04-14 DIAGNOSIS — D2262 Melanocytic nevi of left upper limb, including shoulder: Secondary | ICD-10-CM | POA: Diagnosis not present

## 2018-04-14 DIAGNOSIS — D2261 Melanocytic nevi of right upper limb, including shoulder: Secondary | ICD-10-CM | POA: Diagnosis not present

## 2018-04-14 DIAGNOSIS — D2271 Melanocytic nevi of right lower limb, including hip: Secondary | ICD-10-CM | POA: Diagnosis not present

## 2018-05-06 DIAGNOSIS — Z23 Encounter for immunization: Secondary | ICD-10-CM | POA: Diagnosis not present

## 2018-05-16 DIAGNOSIS — Z79899 Other long term (current) drug therapy: Secondary | ICD-10-CM | POA: Diagnosis not present

## 2018-05-16 DIAGNOSIS — Z125 Encounter for screening for malignant neoplasm of prostate: Secondary | ICD-10-CM | POA: Diagnosis not present

## 2018-05-23 DIAGNOSIS — Z0000 Encounter for general adult medical examination without abnormal findings: Secondary | ICD-10-CM | POA: Diagnosis not present

## 2018-05-23 DIAGNOSIS — Z79899 Other long term (current) drug therapy: Secondary | ICD-10-CM | POA: Diagnosis not present

## 2018-05-23 DIAGNOSIS — Z125 Encounter for screening for malignant neoplasm of prostate: Secondary | ICD-10-CM | POA: Diagnosis not present

## 2018-07-25 DIAGNOSIS — H532 Diplopia: Secondary | ICD-10-CM | POA: Diagnosis not present

## 2018-09-22 DIAGNOSIS — H2513 Age-related nuclear cataract, bilateral: Secondary | ICD-10-CM | POA: Diagnosis not present

## 2018-10-12 ENCOUNTER — Other Ambulatory Visit: Payer: Self-pay | Admitting: Ophthalmology

## 2018-10-12 DIAGNOSIS — H532 Diplopia: Secondary | ICD-10-CM | POA: Diagnosis not present

## 2018-10-12 DIAGNOSIS — H2513 Age-related nuclear cataract, bilateral: Secondary | ICD-10-CM | POA: Diagnosis not present

## 2018-10-19 ENCOUNTER — Other Ambulatory Visit: Payer: Self-pay

## 2018-10-19 ENCOUNTER — Ambulatory Visit
Admission: RE | Admit: 2018-10-19 | Discharge: 2018-10-19 | Disposition: A | Discharge status: Home / Self Care | Payer: PPO | Source: Ambulatory Visit | Attending: Ophthalmology | Admitting: Ophthalmology

## 2018-10-19 DIAGNOSIS — C61 Malignant neoplasm of prostate: Secondary | ICD-10-CM | POA: Diagnosis not present

## 2018-10-19 DIAGNOSIS — H532 Diplopia: Secondary | ICD-10-CM | POA: Diagnosis not present

## 2018-10-19 MED ORDER — GADOBENATE DIMEGLUMINE 529 MG/ML IV SOLN
15.0000 mL | Freq: Once | INTRAVENOUS | Status: AC | PRN
  Administered 2018-10-19: 15 mL via INTRAVENOUS

## 2018-10-21 DIAGNOSIS — Z0000 Encounter for general adult medical examination without abnormal findings: Secondary | ICD-10-CM | POA: Diagnosis not present

## 2018-10-21 DIAGNOSIS — I63412 Cerebral infarction due to embolism of left middle cerebral artery: Secondary | ICD-10-CM | POA: Diagnosis not present

## 2018-10-22 DIAGNOSIS — R002 Palpitations: Secondary | ICD-10-CM | POA: Diagnosis not present

## 2018-10-25 DIAGNOSIS — I639 Cerebral infarction, unspecified: Secondary | ICD-10-CM | POA: Diagnosis not present

## 2018-10-25 DIAGNOSIS — I63512 Cerebral infarction due to unspecified occlusion or stenosis of left middle cerebral artery: Secondary | ICD-10-CM | POA: Diagnosis not present

## 2018-10-25 DIAGNOSIS — I63412 Cerebral infarction due to embolism of left middle cerebral artery: Secondary | ICD-10-CM | POA: Diagnosis not present

## 2018-11-10 DIAGNOSIS — I63412 Cerebral infarction due to embolism of left middle cerebral artery: Secondary | ICD-10-CM | POA: Diagnosis not present

## 2018-11-10 DIAGNOSIS — I10 Essential (primary) hypertension: Secondary | ICD-10-CM | POA: Diagnosis not present

## 2018-11-14 DIAGNOSIS — I63412 Cerebral infarction due to embolism of left middle cerebral artery: Secondary | ICD-10-CM | POA: Diagnosis not present

## 2018-12-22 DIAGNOSIS — H532 Diplopia: Secondary | ICD-10-CM | POA: Diagnosis not present

## 2018-12-22 DIAGNOSIS — H2513 Age-related nuclear cataract, bilateral: Secondary | ICD-10-CM | POA: Diagnosis not present

## 2019-01-02 ENCOUNTER — Ambulatory Visit: Admit: 2019-01-02 | Payer: PPO | Admitting: Ophthalmology

## 2019-01-02 SURGERY — PHACOEMULSIFICATION, CATARACT, WITH IOL INSERTION
Anesthesia: Topical | Laterality: Left

## 2019-01-05 DIAGNOSIS — H2512 Age-related nuclear cataract, left eye: Secondary | ICD-10-CM | POA: Diagnosis not present

## 2019-01-05 DIAGNOSIS — H25812 Combined forms of age-related cataract, left eye: Secondary | ICD-10-CM | POA: Diagnosis not present

## 2019-01-19 DIAGNOSIS — H25811 Combined forms of age-related cataract, right eye: Secondary | ICD-10-CM | POA: Diagnosis not present

## 2019-01-19 DIAGNOSIS — H2511 Age-related nuclear cataract, right eye: Secondary | ICD-10-CM | POA: Diagnosis not present

## 2019-02-10 DIAGNOSIS — I63412 Cerebral infarction due to embolism of left middle cerebral artery: Secondary | ICD-10-CM | POA: Diagnosis not present

## 2019-02-10 DIAGNOSIS — E782 Mixed hyperlipidemia: Secondary | ICD-10-CM | POA: Diagnosis not present

## 2019-02-10 DIAGNOSIS — Z0000 Encounter for general adult medical examination without abnormal findings: Secondary | ICD-10-CM | POA: Diagnosis not present

## 2019-04-13 DIAGNOSIS — L570 Actinic keratosis: Secondary | ICD-10-CM | POA: Diagnosis not present

## 2019-04-13 DIAGNOSIS — D2261 Melanocytic nevi of right upper limb, including shoulder: Secondary | ICD-10-CM | POA: Diagnosis not present

## 2019-04-13 DIAGNOSIS — X32XXXA Exposure to sunlight, initial encounter: Secondary | ICD-10-CM | POA: Diagnosis not present

## 2019-04-13 DIAGNOSIS — D2272 Melanocytic nevi of left lower limb, including hip: Secondary | ICD-10-CM | POA: Diagnosis not present

## 2019-04-13 DIAGNOSIS — D2271 Melanocytic nevi of right lower limb, including hip: Secondary | ICD-10-CM | POA: Diagnosis not present

## 2019-04-13 DIAGNOSIS — D2262 Melanocytic nevi of left upper limb, including shoulder: Secondary | ICD-10-CM | POA: Diagnosis not present

## 2019-04-13 DIAGNOSIS — D225 Melanocytic nevi of trunk: Secondary | ICD-10-CM | POA: Diagnosis not present

## 2019-04-13 DIAGNOSIS — L821 Other seborrheic keratosis: Secondary | ICD-10-CM | POA: Diagnosis not present

## 2019-05-19 DIAGNOSIS — Z79899 Other long term (current) drug therapy: Secondary | ICD-10-CM | POA: Diagnosis not present

## 2019-05-19 DIAGNOSIS — Z125 Encounter for screening for malignant neoplasm of prostate: Secondary | ICD-10-CM | POA: Diagnosis not present

## 2019-05-26 DIAGNOSIS — Z0000 Encounter for general adult medical examination without abnormal findings: Secondary | ICD-10-CM | POA: Diagnosis not present

## 2019-05-26 DIAGNOSIS — E782 Mixed hyperlipidemia: Secondary | ICD-10-CM | POA: Diagnosis not present

## 2019-08-28 DIAGNOSIS — I7300 Raynaud's syndrome without gangrene: Secondary | ICD-10-CM | POA: Diagnosis not present

## 2019-08-28 DIAGNOSIS — M79671 Pain in right foot: Secondary | ICD-10-CM | POA: Diagnosis not present

## 2019-08-28 DIAGNOSIS — D2371 Other benign neoplasm of skin of right lower limb, including hip: Secondary | ICD-10-CM | POA: Diagnosis not present

## 2019-08-29 DIAGNOSIS — H532 Diplopia: Secondary | ICD-10-CM | POA: Diagnosis not present

## 2019-09-04 DIAGNOSIS — H532 Diplopia: Secondary | ICD-10-CM | POA: Diagnosis not present

## 2019-09-18 DIAGNOSIS — M79671 Pain in right foot: Secondary | ICD-10-CM | POA: Diagnosis not present

## 2019-09-18 DIAGNOSIS — I7300 Raynaud's syndrome without gangrene: Secondary | ICD-10-CM | POA: Diagnosis not present

## 2019-09-18 DIAGNOSIS — D2371 Other benign neoplasm of skin of right lower limb, including hip: Secondary | ICD-10-CM | POA: Diagnosis not present

## 2019-09-20 DIAGNOSIS — H5021 Vertical strabismus, right eye: Secondary | ICD-10-CM | POA: Diagnosis not present

## 2019-09-20 DIAGNOSIS — H5034 Intermittent alternating exotropia: Secondary | ICD-10-CM | POA: Diagnosis not present

## 2019-11-17 DIAGNOSIS — E782 Mixed hyperlipidemia: Secondary | ICD-10-CM | POA: Diagnosis not present

## 2019-11-24 DIAGNOSIS — Z0000 Encounter for general adult medical examination without abnormal findings: Secondary | ICD-10-CM | POA: Diagnosis not present

## 2019-11-24 DIAGNOSIS — D500 Iron deficiency anemia secondary to blood loss (chronic): Secondary | ICD-10-CM | POA: Diagnosis not present

## 2019-11-24 DIAGNOSIS — M79671 Pain in right foot: Secondary | ICD-10-CM | POA: Diagnosis not present

## 2019-11-24 DIAGNOSIS — E782 Mixed hyperlipidemia: Secondary | ICD-10-CM | POA: Diagnosis not present

## 2019-11-24 DIAGNOSIS — E538 Deficiency of other specified B group vitamins: Secondary | ICD-10-CM | POA: Diagnosis not present

## 2019-11-24 DIAGNOSIS — I63412 Cerebral infarction due to embolism of left middle cerebral artery: Secondary | ICD-10-CM | POA: Diagnosis not present

## 2019-11-24 DIAGNOSIS — Z125 Encounter for screening for malignant neoplasm of prostate: Secondary | ICD-10-CM | POA: Diagnosis not present

## 2019-11-27 DIAGNOSIS — D500 Iron deficiency anemia secondary to blood loss (chronic): Secondary | ICD-10-CM | POA: Diagnosis not present

## 2019-12-05 DIAGNOSIS — M2580 Other specified joint disorders, unspecified joint: Secondary | ICD-10-CM | POA: Diagnosis not present

## 2019-12-05 DIAGNOSIS — M79672 Pain in left foot: Secondary | ICD-10-CM | POA: Diagnosis not present

## 2019-12-26 DIAGNOSIS — M2580 Other specified joint disorders, unspecified joint: Secondary | ICD-10-CM | POA: Diagnosis not present

## 2019-12-26 DIAGNOSIS — M79672 Pain in left foot: Secondary | ICD-10-CM | POA: Diagnosis not present

## 2020-02-02 DIAGNOSIS — D500 Iron deficiency anemia secondary to blood loss (chronic): Secondary | ICD-10-CM | POA: Diagnosis not present

## 2020-03-13 DIAGNOSIS — Z961 Presence of intraocular lens: Secondary | ICD-10-CM | POA: Diagnosis not present

## 2020-03-13 DIAGNOSIS — H26493 Other secondary cataract, bilateral: Secondary | ICD-10-CM | POA: Diagnosis not present

## 2020-03-13 DIAGNOSIS — H52203 Unspecified astigmatism, bilateral: Secondary | ICD-10-CM | POA: Diagnosis not present

## 2020-03-13 DIAGNOSIS — H532 Diplopia: Secondary | ICD-10-CM | POA: Diagnosis not present

## 2020-03-28 DIAGNOSIS — H26492 Other secondary cataract, left eye: Secondary | ICD-10-CM | POA: Diagnosis not present

## 2020-05-21 DIAGNOSIS — X32XXXA Exposure to sunlight, initial encounter: Secondary | ICD-10-CM | POA: Diagnosis not present

## 2020-05-21 DIAGNOSIS — L570 Actinic keratosis: Secondary | ICD-10-CM | POA: Diagnosis not present

## 2020-05-21 DIAGNOSIS — D2262 Melanocytic nevi of left upper limb, including shoulder: Secondary | ICD-10-CM | POA: Diagnosis not present

## 2020-05-21 DIAGNOSIS — D225 Melanocytic nevi of trunk: Secondary | ICD-10-CM | POA: Diagnosis not present

## 2020-05-21 DIAGNOSIS — D2272 Melanocytic nevi of left lower limb, including hip: Secondary | ICD-10-CM | POA: Diagnosis not present

## 2020-05-21 DIAGNOSIS — D2261 Melanocytic nevi of right upper limb, including shoulder: Secondary | ICD-10-CM | POA: Diagnosis not present

## 2020-05-21 DIAGNOSIS — L821 Other seborrheic keratosis: Secondary | ICD-10-CM | POA: Diagnosis not present

## 2020-05-21 DIAGNOSIS — D2271 Melanocytic nevi of right lower limb, including hip: Secondary | ICD-10-CM | POA: Diagnosis not present

## 2020-05-31 DIAGNOSIS — E782 Mixed hyperlipidemia: Secondary | ICD-10-CM | POA: Diagnosis not present

## 2020-05-31 DIAGNOSIS — Z125 Encounter for screening for malignant neoplasm of prostate: Secondary | ICD-10-CM | POA: Diagnosis not present

## 2020-05-31 DIAGNOSIS — E538 Deficiency of other specified B group vitamins: Secondary | ICD-10-CM | POA: Diagnosis not present

## 2020-05-31 DIAGNOSIS — D500 Iron deficiency anemia secondary to blood loss (chronic): Secondary | ICD-10-CM | POA: Diagnosis not present

## 2020-06-07 DIAGNOSIS — E782 Mixed hyperlipidemia: Secondary | ICD-10-CM | POA: Diagnosis not present

## 2020-06-07 DIAGNOSIS — Z23 Encounter for immunization: Secondary | ICD-10-CM | POA: Diagnosis not present

## 2020-06-07 DIAGNOSIS — D692 Other nonthrombocytopenic purpura: Secondary | ICD-10-CM | POA: Diagnosis not present

## 2020-06-07 DIAGNOSIS — Z0000 Encounter for general adult medical examination without abnormal findings: Secondary | ICD-10-CM | POA: Diagnosis not present

## 2020-11-28 DIAGNOSIS — E782 Mixed hyperlipidemia: Secondary | ICD-10-CM | POA: Diagnosis not present

## 2020-11-28 DIAGNOSIS — D692 Other nonthrombocytopenic purpura: Secondary | ICD-10-CM | POA: Diagnosis not present

## 2020-12-05 DIAGNOSIS — I63412 Cerebral infarction due to embolism of left middle cerebral artery: Secondary | ICD-10-CM | POA: Diagnosis not present

## 2020-12-05 DIAGNOSIS — Z0000 Encounter for general adult medical examination without abnormal findings: Secondary | ICD-10-CM | POA: Diagnosis not present

## 2020-12-05 DIAGNOSIS — E782 Mixed hyperlipidemia: Secondary | ICD-10-CM | POA: Diagnosis not present

## 2020-12-05 DIAGNOSIS — Z125 Encounter for screening for malignant neoplasm of prostate: Secondary | ICD-10-CM | POA: Diagnosis not present

## 2020-12-05 DIAGNOSIS — D692 Other nonthrombocytopenic purpura: Secondary | ICD-10-CM | POA: Diagnosis not present

## 2021-02-20 DIAGNOSIS — N50811 Right testicular pain: Secondary | ICD-10-CM | POA: Diagnosis not present

## 2021-02-20 DIAGNOSIS — H9311 Tinnitus, right ear: Secondary | ICD-10-CM | POA: Diagnosis not present

## 2021-02-20 DIAGNOSIS — N50812 Left testicular pain: Secondary | ICD-10-CM | POA: Diagnosis not present

## 2021-03-17 IMAGING — MR MRI HEAD WITHOUT AND WITH CONTRAST
13 series · 48 of 48 positions shown · IV contrast (multihance)
Comparison: None.

CLINICAL DATA: 77-year-old male with double vision since [REDACTED].
Prostate cancer.

Creatinine was obtained on site at [HOSPITAL] at [HOSPITAL].
Results: Creatinine 1.0 mg/dL.
EXAM:
MRI HEAD WITHOUT AND WITH CONTRAST
TECHNIQUE: Multiplanar, multiecho pulse sequences of the brain and surrounding
structures were obtained without and with intravenous contrast.
CONTRAST:  15mL MULTIHANCE GADOBENATE DIMEGLUMINE 529 MG/ML IV SOLN

[Series 6: T1 · sagittal · 4.0mm · 0.75mm/px · 1 of 31 slices shown (1 of 3)]
[im 1/31]
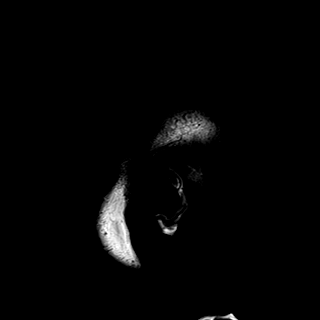

[Series 7: DWI · axial · 3.0mm · 1.44mm/px · z∈[-12,+107]mm · 4 of 84 slices shown (1 of 4)]
[im 1/84]
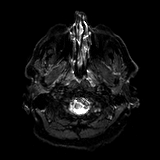
[im 28/84]
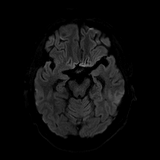
[im 56/84]
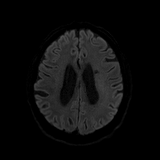
[im 84/84]
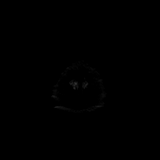

[Series 8: DWI · axial · 3.0mm · 1.44mm/px · z∈[-12,+107]mm · 3 of 42 slices shown (2 of 4)]
[im 1/42]
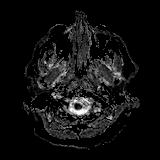
[im 21/42]
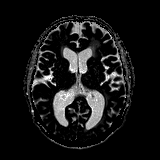
[im 42/42]
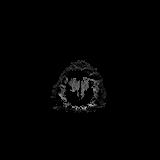

[Series 9: DWI · coronal · 5.0mm · 1.44mm/px · 4 of 64 slices shown (3 of 4)]
[im 1/64]
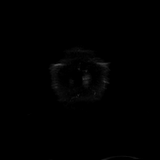
[im 22/64]
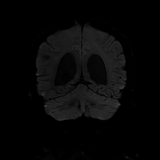
[im 43/64]
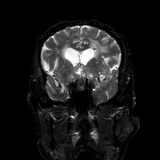
[im 64/64]
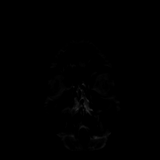

[Series 10: DWI · coronal · 5.0mm · 1.44mm/px · 2 of 32 slices shown (4 of 4)]
[im 1/32]
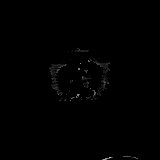
[im 32/32]
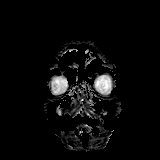

[Series 11: T2 · axial · 4.0mm · 0.36mm/px · z∈[-13,+106]mm · 2 of 27 slices shown]
[im 1/27]
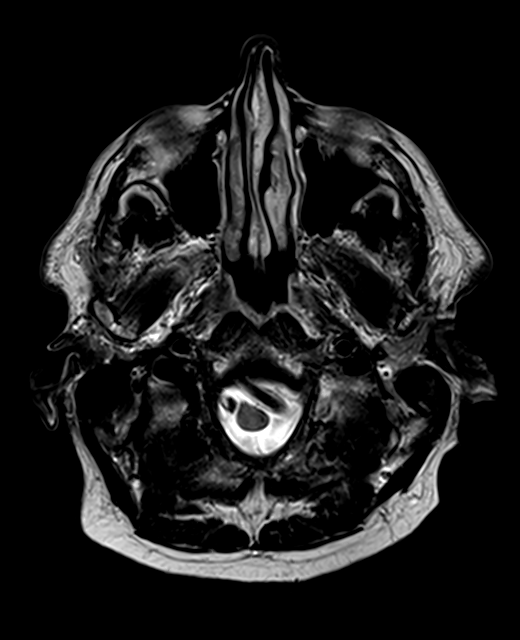
[im 27/27]
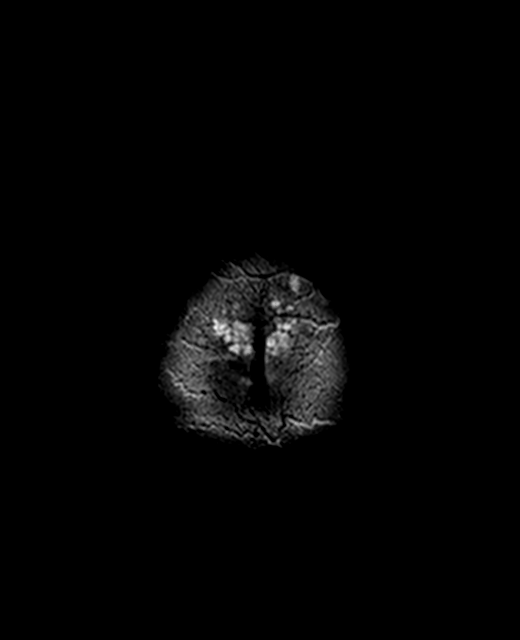

[Series 13: FLAIR · axial · 3.0mm · 0.72mm/px · z∈[-19,+113]mm · 2 of 26 slices shown]
[im 1/26]
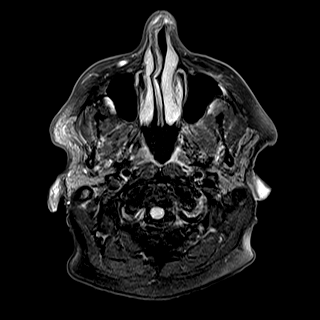
[im 26/26]
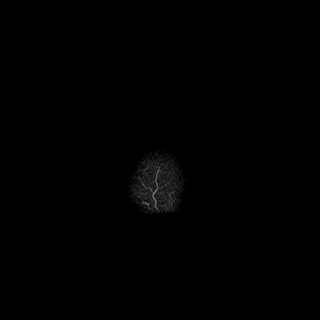

[Series 15: swi_images · axial · 1.5mm · 0.90mm/px · z∈[-17,+109]mm · 6 of 96 slices shown]
[im 1/96]
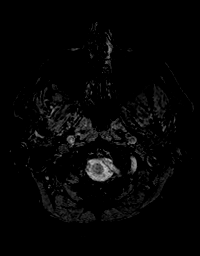
[im 20/96]
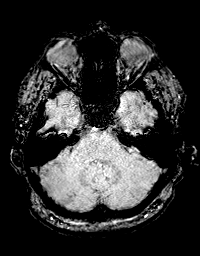
[im 39/96]
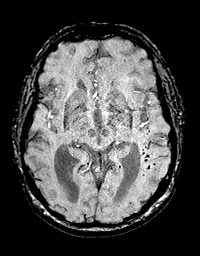
[im 58/96]
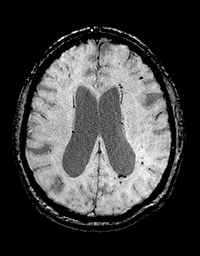
[im 77/96]
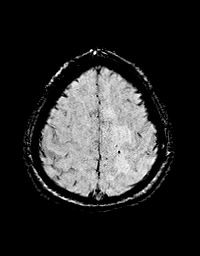
[im 96/96]
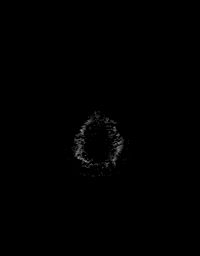

[Series 16: T1 · axial · 1.0mm · 0.90mm/px · z∈[-17,+109]mm · 9 of 144 slices shown (2 of 3)]
[im 1/144]
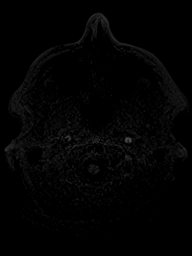
[im 18/144]
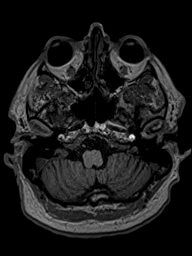
[im 36/144]
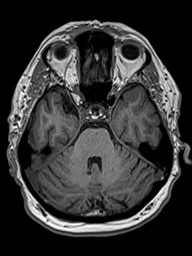
[im 54/144]
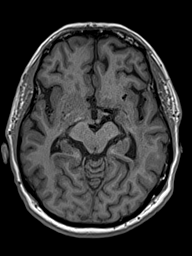
[im 72/144]
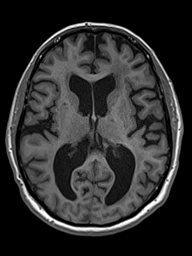
[im 90/144]
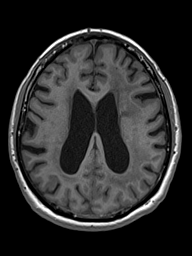
[im 108/144]
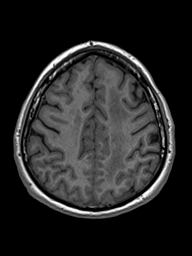
[im 126/144]
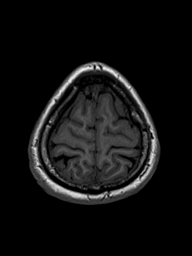
[im 144/144]
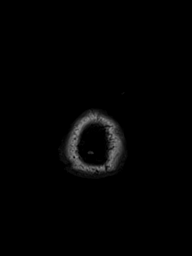

[Series 17: T2 post-contrast · coronal · 4.0mm · 0.36mm/px · 2 of 35 slices shown]
[im 1/35]
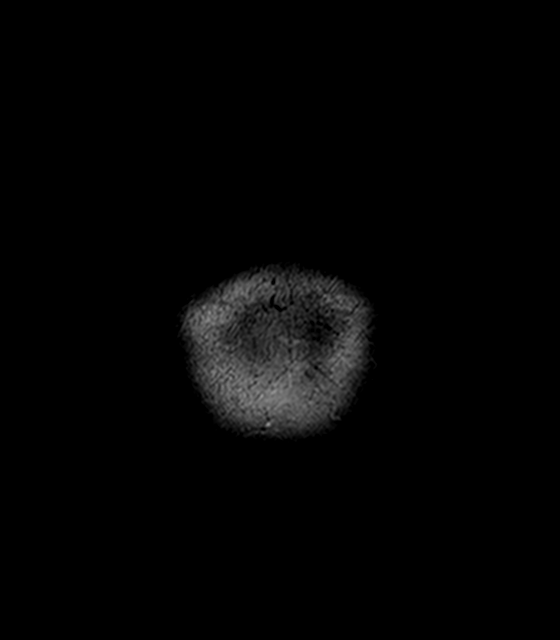
[im 35/35]
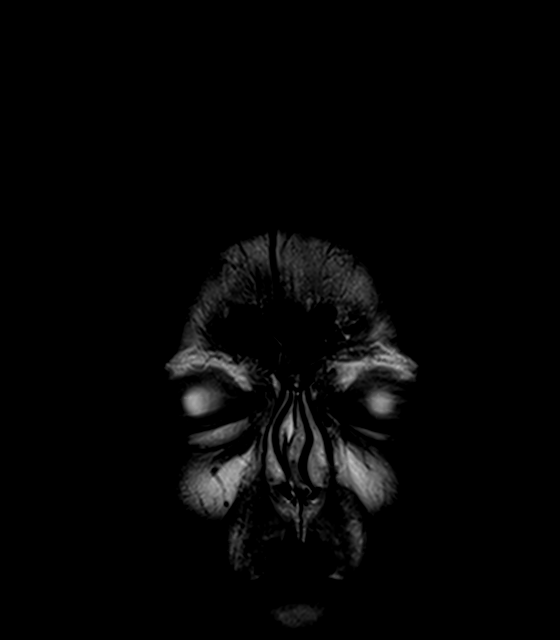

[Series 18: T1 · axial · 1.0mm · 0.90mm/px · z∈[-17,+109]mm · 9 of 144 slices shown (3 of 3)]
[im 1/144]
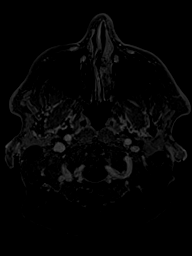
[im 18/144]
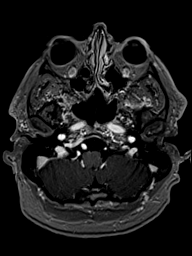
[im 36/144]
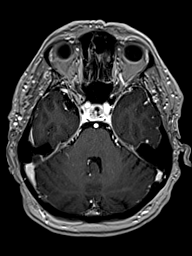
[im 54/144]
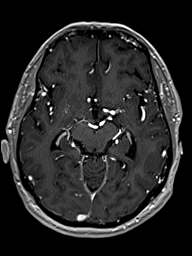
[im 72/144]
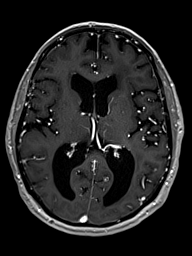
[im 90/144]
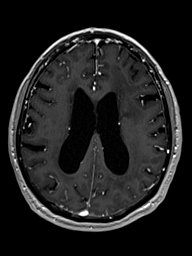
[im 108/144]
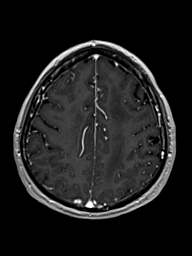
[im 126/144]
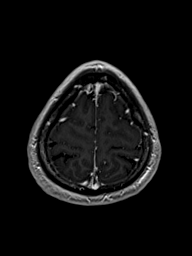
[im 144/144]
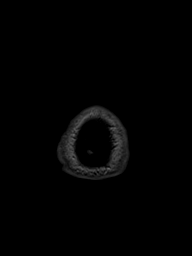

[Series 19: T1 post-contrast · coronal · 4.0mm · 0.72mm/px · 2 of 35 slices shown (1 of 2)]
[im 1/35]
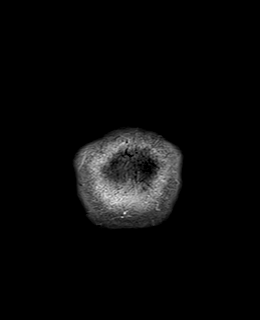
[im 35/35]
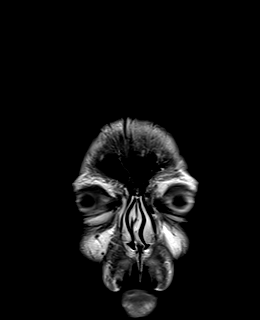

[Series 20: T1 post-contrast · sagittal · 4.0mm · 0.75mm/px · 2 of 31 slices shown (2 of 2)]
[im 1/31]
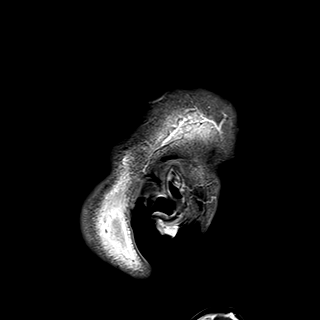
[im 31/31]
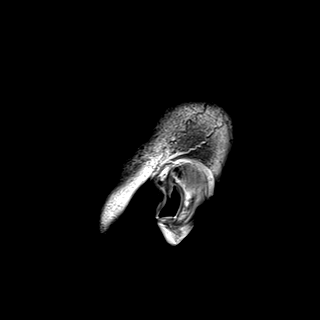

[48 of 48 positions shown; findings below may reference images not displayed]

FINDINGS: Brain: No restricted diffusion to suggest acute infarction. No
midline shift, mass effect, evidence of mass lesion,
ventriculomegaly, extra-axial collection or acute intracranial
hemorrhage. Cervicomedullary junction and pituitary are within
normal limits.

Confluent bilateral cerebral white matter T2 and FLAIR
hyperintensity, slightly greater in the left hemisphere. Numerous
chronic microhemorrhages in the medial left centrum semiovale
(series 15, image 69). Chronic lacunar infarct of the anterior left
corona radiata. Numerous additional chronic micro hemorrhages also
in the left periatrial and temporal lobe white matter. Scattered
additional microhemorrhages elsewhere in the left hemisphere,
although no definite chronic blood products in the right hemisphere
or posterior fossa.

No discrete cortical encephalomalacia identified. Deep gray matter
nuclei, brainstem and cerebellum signal within normal limits.
Nonspecific bilateral perisylvian volume loss.

No abnormal enhancement identified.  No dural thickening.

Vascular: Major intracranial vascular flow voids are preserved.
There is moderate generalized intracranial artery dolichoectasia.
The major dural venous sinuses are enhancing and appear to be
patent.

Skull and upper cervical spine: Visualized bone marrow signal is
within normal limits. Negative visible cervical spine and spinal
cord.

Sinuses/Orbits: Normal suprasellar cistern and optic chiasm. The
cavernous sinus appears normal. Orbits soft tissues appears
symmetric and normal aside from mildly Disconjugate gaze (series 11,
image 4).

Paranasal Visualized paranasal sinuses and mastoids are stable and
well pneumatized.

Other: Visible internal auditory structures appear normal. Normal
stylomastoid foramina. Scalp and face soft tissues appear negative.
IMPRESSION: 1. No metastatic disease or acute intracranial abnormality
identified.
2. Advanced cerebral white matter disease, worse in the left
hemisphere where there are also numerous chronic microhemorrhages.
This might indicate chronic emboli from the left carotid. Lack of
chronic blood products in the right hemisphere and posterior fossa
argues against amyloid angiopathy.
3. Intracranial artery dolichoectasia which might indicate chronic
hypertension.
4. Disconjugate gaze but otherwise negative visible orbits. If there
is a discrete vision related cranial neuropathy then dedicated Orbit
MRI without and with contrast may be valuable.

## 2021-04-18 DIAGNOSIS — H90A21 Sensorineural hearing loss, unilateral, right ear, with restricted hearing on the contralateral side: Secondary | ICD-10-CM | POA: Diagnosis not present

## 2021-04-18 DIAGNOSIS — H8109 Meniere's disease, unspecified ear: Secondary | ICD-10-CM | POA: Diagnosis not present

## 2021-04-18 DIAGNOSIS — H6123 Impacted cerumen, bilateral: Secondary | ICD-10-CM | POA: Diagnosis not present

## 2021-04-18 DIAGNOSIS — H903 Sensorineural hearing loss, bilateral: Secondary | ICD-10-CM | POA: Diagnosis not present

## 2021-05-16 DIAGNOSIS — H3500 Unspecified background retinopathy: Secondary | ICD-10-CM | POA: Diagnosis not present

## 2021-05-16 DIAGNOSIS — H532 Diplopia: Secondary | ICD-10-CM | POA: Diagnosis not present

## 2021-05-16 DIAGNOSIS — H52203 Unspecified astigmatism, bilateral: Secondary | ICD-10-CM | POA: Diagnosis not present

## 2021-05-16 DIAGNOSIS — H26491 Other secondary cataract, right eye: Secondary | ICD-10-CM | POA: Diagnosis not present

## 2021-05-21 DIAGNOSIS — D2261 Melanocytic nevi of right upper limb, including shoulder: Secondary | ICD-10-CM | POA: Diagnosis not present

## 2021-05-21 DIAGNOSIS — L821 Other seborrheic keratosis: Secondary | ICD-10-CM | POA: Diagnosis not present

## 2021-05-21 DIAGNOSIS — D2262 Melanocytic nevi of left upper limb, including shoulder: Secondary | ICD-10-CM | POA: Diagnosis not present

## 2021-05-21 DIAGNOSIS — D225 Melanocytic nevi of trunk: Secondary | ICD-10-CM | POA: Diagnosis not present

## 2021-05-21 DIAGNOSIS — D2272 Melanocytic nevi of left lower limb, including hip: Secondary | ICD-10-CM | POA: Diagnosis not present

## 2021-05-21 DIAGNOSIS — D2271 Melanocytic nevi of right lower limb, including hip: Secondary | ICD-10-CM | POA: Diagnosis not present

## 2021-06-03 DIAGNOSIS — I63412 Cerebral infarction due to embolism of left middle cerebral artery: Secondary | ICD-10-CM | POA: Diagnosis not present

## 2021-06-03 DIAGNOSIS — Z125 Encounter for screening for malignant neoplasm of prostate: Secondary | ICD-10-CM | POA: Diagnosis not present

## 2021-06-03 DIAGNOSIS — D692 Other nonthrombocytopenic purpura: Secondary | ICD-10-CM | POA: Diagnosis not present

## 2021-06-03 DIAGNOSIS — E782 Mixed hyperlipidemia: Secondary | ICD-10-CM | POA: Diagnosis not present

## 2021-06-10 DIAGNOSIS — I63412 Cerebral infarction due to embolism of left middle cerebral artery: Secondary | ICD-10-CM | POA: Diagnosis not present

## 2021-06-10 DIAGNOSIS — Z0000 Encounter for general adult medical examination without abnormal findings: Secondary | ICD-10-CM | POA: Diagnosis not present

## 2021-06-10 DIAGNOSIS — E782 Mixed hyperlipidemia: Secondary | ICD-10-CM | POA: Diagnosis not present

## 2021-12-02 DIAGNOSIS — E782 Mixed hyperlipidemia: Secondary | ICD-10-CM | POA: Diagnosis not present

## 2021-12-09 DIAGNOSIS — R739 Hyperglycemia, unspecified: Secondary | ICD-10-CM | POA: Diagnosis not present

## 2021-12-09 DIAGNOSIS — Z0000 Encounter for general adult medical examination without abnormal findings: Secondary | ICD-10-CM | POA: Diagnosis not present

## 2021-12-09 DIAGNOSIS — E782 Mixed hyperlipidemia: Secondary | ICD-10-CM | POA: Diagnosis not present

## 2021-12-09 DIAGNOSIS — Z125 Encounter for screening for malignant neoplasm of prostate: Secondary | ICD-10-CM | POA: Diagnosis not present

## 2021-12-09 DIAGNOSIS — I63412 Cerebral infarction due to embolism of left middle cerebral artery: Secondary | ICD-10-CM | POA: Diagnosis not present

## 2021-12-09 DIAGNOSIS — D692 Other nonthrombocytopenic purpura: Secondary | ICD-10-CM | POA: Diagnosis not present

## 2021-12-23 DIAGNOSIS — D239 Other benign neoplasm of skin, unspecified: Secondary | ICD-10-CM | POA: Diagnosis not present

## 2021-12-23 DIAGNOSIS — M65342 Trigger finger, left ring finger: Secondary | ICD-10-CM | POA: Diagnosis not present

## 2022-02-11 DIAGNOSIS — W57XXXA Bitten or stung by nonvenomous insect and other nonvenomous arthropods, initial encounter: Secondary | ICD-10-CM | POA: Diagnosis not present

## 2022-02-11 DIAGNOSIS — Z91038 Other insect allergy status: Secondary | ICD-10-CM | POA: Diagnosis not present

## 2022-02-11 DIAGNOSIS — M65342 Trigger finger, left ring finger: Secondary | ICD-10-CM | POA: Diagnosis not present

## 2022-02-11 DIAGNOSIS — S0086XA Insect bite (nonvenomous) of other part of head, initial encounter: Secondary | ICD-10-CM | POA: Diagnosis not present

## 2022-02-18 DIAGNOSIS — M65342 Trigger finger, left ring finger: Secondary | ICD-10-CM | POA: Diagnosis not present

## 2022-03-13 DIAGNOSIS — S0086XD Insect bite (nonvenomous) of other part of head, subsequent encounter: Secondary | ICD-10-CM | POA: Diagnosis not present

## 2022-03-13 DIAGNOSIS — F419 Anxiety disorder, unspecified: Secondary | ICD-10-CM | POA: Diagnosis not present

## 2022-03-13 DIAGNOSIS — I10 Essential (primary) hypertension: Secondary | ICD-10-CM | POA: Diagnosis not present

## 2022-04-20 DIAGNOSIS — H903 Sensorineural hearing loss, bilateral: Secondary | ICD-10-CM | POA: Diagnosis not present

## 2022-04-20 DIAGNOSIS — H90A21 Sensorineural hearing loss, unilateral, right ear, with restricted hearing on the contralateral side: Secondary | ICD-10-CM | POA: Diagnosis not present

## 2022-04-20 DIAGNOSIS — H8101 Meniere's disease, right ear: Secondary | ICD-10-CM | POA: Diagnosis not present

## 2022-04-22 DIAGNOSIS — G2581 Restless legs syndrome: Secondary | ICD-10-CM | POA: Diagnosis not present

## 2022-04-22 DIAGNOSIS — Z23 Encounter for immunization: Secondary | ICD-10-CM | POA: Diagnosis not present

## 2022-04-22 DIAGNOSIS — I63412 Cerebral infarction due to embolism of left middle cerebral artery: Secondary | ICD-10-CM | POA: Diagnosis not present

## 2022-05-21 DIAGNOSIS — D2272 Melanocytic nevi of left lower limb, including hip: Secondary | ICD-10-CM | POA: Diagnosis not present

## 2022-05-21 DIAGNOSIS — D225 Melanocytic nevi of trunk: Secondary | ICD-10-CM | POA: Diagnosis not present

## 2022-05-21 DIAGNOSIS — L570 Actinic keratosis: Secondary | ICD-10-CM | POA: Diagnosis not present

## 2022-05-21 DIAGNOSIS — D2262 Melanocytic nevi of left upper limb, including shoulder: Secondary | ICD-10-CM | POA: Diagnosis not present

## 2022-05-21 DIAGNOSIS — D2261 Melanocytic nevi of right upper limb, including shoulder: Secondary | ICD-10-CM | POA: Diagnosis not present

## 2022-05-21 DIAGNOSIS — L821 Other seborrheic keratosis: Secondary | ICD-10-CM | POA: Diagnosis not present

## 2022-05-21 DIAGNOSIS — D2271 Melanocytic nevi of right lower limb, including hip: Secondary | ICD-10-CM | POA: Diagnosis not present

## 2022-05-21 DIAGNOSIS — X32XXXA Exposure to sunlight, initial encounter: Secondary | ICD-10-CM | POA: Diagnosis not present

## 2022-05-26 DIAGNOSIS — H52203 Unspecified astigmatism, bilateral: Secondary | ICD-10-CM | POA: Diagnosis not present

## 2022-05-26 DIAGNOSIS — H532 Diplopia: Secondary | ICD-10-CM | POA: Diagnosis not present

## 2022-05-26 DIAGNOSIS — Z961 Presence of intraocular lens: Secondary | ICD-10-CM | POA: Diagnosis not present

## 2022-06-04 DIAGNOSIS — M65342 Trigger finger, left ring finger: Secondary | ICD-10-CM | POA: Diagnosis not present

## 2022-06-04 DIAGNOSIS — K5900 Constipation, unspecified: Secondary | ICD-10-CM | POA: Diagnosis not present

## 2022-06-04 DIAGNOSIS — Z8673 Personal history of transient ischemic attack (TIA), and cerebral infarction without residual deficits: Secondary | ICD-10-CM | POA: Diagnosis not present

## 2022-06-04 DIAGNOSIS — F419 Anxiety disorder, unspecified: Secondary | ICD-10-CM | POA: Diagnosis not present

## 2022-06-04 DIAGNOSIS — I10 Essential (primary) hypertension: Secondary | ICD-10-CM | POA: Diagnosis not present

## 2022-06-24 DIAGNOSIS — E782 Mixed hyperlipidemia: Secondary | ICD-10-CM | POA: Diagnosis not present

## 2022-06-24 DIAGNOSIS — R739 Hyperglycemia, unspecified: Secondary | ICD-10-CM | POA: Diagnosis not present

## 2022-06-24 DIAGNOSIS — Z125 Encounter for screening for malignant neoplasm of prostate: Secondary | ICD-10-CM | POA: Diagnosis not present

## 2022-07-02 DIAGNOSIS — G2581 Restless legs syndrome: Secondary | ICD-10-CM | POA: Diagnosis not present

## 2022-07-02 DIAGNOSIS — Z0000 Encounter for general adult medical examination without abnormal findings: Secondary | ICD-10-CM | POA: Diagnosis not present

## 2022-07-02 DIAGNOSIS — E782 Mixed hyperlipidemia: Secondary | ICD-10-CM | POA: Diagnosis not present

## 2022-07-02 DIAGNOSIS — I63412 Cerebral infarction due to embolism of left middle cerebral artery: Secondary | ICD-10-CM | POA: Diagnosis not present

## 2022-10-08 DIAGNOSIS — H8103 Meniere's disease, bilateral: Secondary | ICD-10-CM | POA: Diagnosis not present

## 2022-10-08 DIAGNOSIS — H903 Sensorineural hearing loss, bilateral: Secondary | ICD-10-CM | POA: Diagnosis not present

## 2022-10-08 DIAGNOSIS — H90A21 Sensorineural hearing loss, unilateral, right ear, with restricted hearing on the contralateral side: Secondary | ICD-10-CM | POA: Diagnosis not present

## 2022-10-08 DIAGNOSIS — J301 Allergic rhinitis due to pollen: Secondary | ICD-10-CM | POA: Diagnosis not present

## 2022-11-20 ENCOUNTER — Ambulatory Visit: Payer: PPO | Attending: Internal Medicine | Admitting: Physical Therapy

## 2022-11-20 DIAGNOSIS — M2560 Stiffness of unspecified joint, not elsewhere classified: Secondary | ICD-10-CM | POA: Insufficient documentation

## 2022-11-20 DIAGNOSIS — R293 Abnormal posture: Secondary | ICD-10-CM | POA: Insufficient documentation

## 2022-11-20 DIAGNOSIS — M6281 Muscle weakness (generalized): Secondary | ICD-10-CM | POA: Diagnosis not present

## 2022-11-21 ENCOUNTER — Encounter: Payer: Self-pay | Admitting: Physical Therapy

## 2022-11-21 NOTE — Therapy (Signed)
OUTPATIENT PHYSICAL THERAPY CERVICAL EVALUATION   Patient Name: Frank Gonzalez MRN: [id number] DOB:[birth year], 82 y.o., male Today's Date: 11/21/2022  END OF SESSION:  PT End of Session - 11/21/22 0843     Visit Number 1    Number of Visits 8    Date for PT Re-Evaluation 01/15/23    PT Start Time 0903    PT Stop Time 1001    PT Time Calculation (min) 58 min             Past Medical History:  Diagnosis Date   Anal fissure    Cancer    prostate   Diverticulitis large intestine    GERD (gastroesophageal reflux disease)    Inguinal hernia    Meniere disease    RLS (restless legs syndrome)    Schatzki's ring    Past Surgical History:  Procedure Laterality Date   ANAL FISSURE REPAIR  2004   COLONOSCOPY WITH PROPOFOL N/A 09/25/2015   Procedure: COLONOSCOPY WITH PROPOFOL;  Surgeon: Scot Jun, MD;  Location: [facility] ENDOSCOPY;  Service: Endoscopy;  Laterality: N/A;   HERNIA REPAIR     PROSTATECTOMY  2002   There are no problems to display for this patient.   PCP: Danella Penton, MD  REFERRING PROVIDER: Danella Penton, MD  REFERRING DIAG: Upper extremity muscle weakness    Kyphosis  THERAPY DIAG:  Abnormal posture  Muscle weakness (generalized)  Joint stiffness of spine  Rationale for Evaluation and Treatment: Rehabilitation  ONSET DATE: 08/03/2016  (chronic)  SUBJECTIVE:                                                                                                                                                                                                         SUBJECTIVE STATEMENT: Pt./wife report worsening of thoracic kyphotic posture/ cervical flexion with standing tasks. Pts. Wife states her husband walks around looking at floor.  Pt. Able to correct head/upright position with cuing but returns to flexed posture.  Pt. Attends gym ([employer]) 2x/week with personal trainer, Beth, to work on generalized strengthening/ conditioning.  Pt. Using TM  with light UE assist for safety.  No c/o neck pain at this time.   Hand dominance: Right  PERTINENT HISTORY:  Pt. Known to PT clinic.  Pts. wife, Bonita Quin, present to assist with PMHx.    PAIN:  Are you having pain? No  PRECAUTIONS: None  WEIGHT BEARING RESTRICTIONS: No  FALLS:  Has patient fallen in last 6 months? No  LIVING ENVIRONMENT: Lives with: lives with their spouse Lives  in: House/apartment Has following equipment at home: None  OCCUPATION: Retired  PLOF: Independent  PATIENT GOALS: Increase neck/UE muscle strength and improve posture.  NEXT MD VISIT: PRN  OBJECTIVE:   DIAGNOSTIC FINDINGS:  No recent imaging in chart  PATIENT SURVEYS:  NDI :TBD FOTO :TBD  COGNITION: Overall cognitive status: Within functional limits for tasks assessed  SENSATION: WFL  POSTURE: rounded shoulders, forward head, and increased thoracic kyphosis  PALPATION: No tenderness with palpation along cervical/thoracic paraspinals.  Tightness noted in B pec musculature.   CERVICAL ROM:  Seated position  Active ROM A/PROM (deg) eval  Flexion 69 deg  Extension 15 deg  Right lateral flexion 12 deg.  Left lateral flexion 12 deg.  Right rotation 55 deg.  Left rotation 52 deg.   (Blank rows = not tested)  UPPER EXTREMITY ROM:  B UE AROM WFL.  No shoulder/elbow limitations noted.    UPPER EXTREMITY MMT:  MMT Right eval Left eval  Shoulder flexion 4+/5 4+/5  Shoulder extension 5/5 5/5  Shoulder abduction 4/5 4/5  Shoulder adduction 5/5 5/5      Shoulder internal rotation 5/5 5/5  Shoulder external rotation 5/5 5/5  Middle trapezius 5/5 5/5  Lower trapezius    Elbow flexion 5/5 5/5  Elbow extension 5/5 5/5  Wrist flexion    Wrist extension    Wrist ulnar deviation    Wrist radial deviation    Wrist pronation    Wrist supination    Grip strength TBD TBD   (Blank rows = not tested)  CERVICAL SPECIAL TESTS:  NT  FUNCTIONAL TESTS:  NT  TODAY'S TREATMENT:                                                                                                                               DATE: 11/21/2022  Evaluation/ See HEP/ Reviewed current ex. With [employer] (trainer)- wall shoulder flexion/ ER/ scapular retraction   PATIENT EDUCATION:  Education details: Access Code: [email or web address] Person educated: Patient and Spouse Education method: Explanation, Demonstration, and Handouts Education comprehension: verbalized understanding and returned demonstration  HOME EXERCISE PROGRAM: Access Code: [email or web address] URL: [email or web address] Date: 11/20/2022 Prepared by: Dorene Grebe  Exercises - Supine Cervical Retraction with Towel  - 2 x daily - 7 x weekly - 1 sets - 5 reps - Supine Shoulder Flexion with Dowel AAROM - Palms Up  - 2 x daily - 7 x weekly - 1 sets - 5 reps - Supine Chest Stretch with Elbows Bent  - 2 x daily - 7 x weekly - 1 sets - 5 reps  ASSESSMENT:  CLINICAL IMPRESSION: Patient is a pleasant 82 y.o. male who was seen today for physical therapy evaluation and treatment for kyphotic posture/ muscle weakness.  Pt. reports 0/10 neck pain currently at rest and remains active with gym based ex. program for strengthening/ cardio 2x/week. Pt. presents with moderate thoracic kyphosis/ forward head posture and limited c-spine ROM.   Difficutly  with supine/ seated chin tucks due to forward head posture. Pt. prefers cervical extension position during chin tucks.  No radicular symptoms during tx. session.  Pt. will benefit from short-term skilled PT services to increase cervical AROM/ upright posture to progress to gym based ex. with personal trainer.   OBJECTIVE IMPAIRMENTS: decreased activity tolerance, decreased endurance, decreased mobility, decreased ROM, decreased strength, hypomobility, impaired flexibility, impaired UE functional use, improper body mechanics, postural dysfunction, and pain.   ACTIVITY LIMITATIONS: carrying, lifting, standing, and  locomotion level  PARTICIPATION LIMITATIONS: driving, community activity, and yard work  PERSONAL FACTORS: Past/current experiences are also affecting patient's functional outcome.   REHAB POTENTIAL: Good  CLINICAL DECISION MAKING: Stable/uncomplicated  EVALUATION COMPLEXITY: Low   GOALS: Goals reviewed with patient? Yes  SHORT TERM GOALS: Target date: 12/18/22  Pt. Independent with gym based/ HEP to increase B shoulder flexion/ abducton 1/2 muscle grade to improve UE mobility.  Baseline:  See above Goal status: INITIAL   LONG TERM GOALS: Target date: 01/15/23  Pt. Will complete FOTO and improve to goal status in 8 weeks.  Baseline: TBD Goal status: INITIAL  2.  Pt. Will demonstrate proper chin tucks in supine/ seated posture with no increase in neck pain to improve posture.  Baseline: moderate verbal/tactile cuing  Goal status:  INITIAL  3.  Pt. Able to maintain proper upright posture/ head position for 30 minutes while working on car in garage.   Baseline: <5 minutes (reported by wife) Goal status: INITIAL  4.  Pt. Will increase cervical lateral flexion to >30 deg. To improve pain-free mobility.   Baseline:  12 deg. L/R (significant stiffness) Goal status: INITIAL   PLAN:  PT FREQUENCY: 1x/week  PT DURATION: 8 weeks  PLANNED INTERVENTIONS: Therapeutic exercises, Therapeutic activity, Neuromuscular re-education, Gait training, Patient/Family education, Self Care, Joint mobilization, Dry Needling, Spinal mobilization, Cryotherapy, Moist heat, Traction, Manual therapy, and Re-evaluation  PLAN FOR NEXT SESSION: Pec stretches/ thoracic extension/ discuss appt.with personal trainer [employer]).  Complete FOTO and NDI  Cammie Mcgee, PT, DPT # [phone] 11/21/2022, 8:47 AM

## 2022-12-02 NOTE — Therapy (Unsigned)
OUTPATIENT PHYSICAL THERAPY CERVICALTREATMENT   Patient Name: Frank Gonzalez MRN: [id number] DOB:March 19, 1941, 82 y.o., male Today's Date: 12/06/2022  END OF SESSION:  PT End of Session - 12/06/22 1739     Visit Number 2    Number of Visits 8    Date for PT Re-Evaluation 01/15/23    PT Start Time 1015    PT Stop Time 1100    PT Time Calculation (min) 45 min    Behavior During Therapy [facility] for tasks assessed/performed            Past Medical History:  Diagnosis Date   Anal fissure    Cancer (HCC)    prostate   Diverticulitis large intestine    GERD (gastroesophageal reflux disease)    Inguinal hernia    Meniere disease    RLS (restless legs syndrome)    Schatzki's ring    Past Surgical History:  Procedure Laterality Date   ANAL FISSURE REPAIR  2004   COLONOSCOPY WITH PROPOFOL N/A 09/25/2015   Procedure: COLONOSCOPY WITH PROPOFOL;  Surgeon: Scot Jun, MD;  Location: [facility] ENDOSCOPY;  Service: Endoscopy;  Laterality: N/A;   HERNIA REPAIR     PROSTATECTOMY  2002   There are no problems to display for this patient.   PCP: Danella Penton, MD  REFERRING PROVIDER: Danella Penton, MD  REFERRING DIAG: Upper extremity muscle weakness    Kyphosis  THERAPY DIAG:  Abnormal posture  Muscle weakness (generalized)  Rationale for Evaluation and Treatment: Rehabilitation  ONSET DATE: 08/03/2016  (chronic)  FROM INITIAL EVALUATION SUBJECTIVE:                                                                                                                                                                                                         SUBJECTIVE STATEMENT: Pt./wife report worsening of thoracic kyphotic posture/ cervical flexion with standing tasks. Pts. Wife states her husband walks around looking at floor.  Pt. Able to correct head/upright position with cuing but returns to flexed posture.  Pt. Attends gym ([employer]) 2x/week with personal trainer, Beth, to work  on generalized strengthening/ conditioning.  Pt. Using TM with light UE assist for safety.  No c/o neck pain at this time.    Hand dominance: Right  PERTINENT HISTORY:  Pt. Known to PT clinic.  Pts. wife, Bonita Quin, present to assist with PMHx.    PAIN:  Are you having pain? No  PRECAUTIONS: None  WEIGHT BEARING RESTRICTIONS: No  FALLS:  Has patient fallen in last 6 months? No  LIVING  ENVIRONMENT: Lives with: lives with their spouse Lives in: House/apartment Has following equipment at home: None  OCCUPATION: Retired  PLOF: Independent  PATIENT GOALS: Increase neck/UE muscle strength and improve posture.  NEXT MD VISIT: PRN  OBJECTIVE:   DIAGNOSTIC FINDINGS:  No recent imaging in chart  PATIENT SURVEYS:  NDI :TBD FOTO :TBD  COGNITION: Overall cognitive status: Within functional limits for tasks assessed  SENSATION: WFL  POSTURE: rounded shoulders, forward head, and increased thoracic kyphosis  PALPATION: No tenderness with palpation along cervical/thoracic paraspinals.  Tightness noted in B pec musculature.   CERVICAL ROM:  Seated position  Active ROM A/PROM (deg) eval  Flexion 69 deg  Extension 15 deg  Right lateral flexion 12 deg.  Left lateral flexion 12 deg.  Right rotation 55 deg.  Left rotation 52 deg.   (Blank rows = not tested)  UPPER EXTREMITY ROM:  B UE AROM WFL.  No shoulder/elbow limitations noted.    UPPER EXTREMITY MMT:  MMT Right eval Left eval  Shoulder flexion 4+/5 4+/5  Shoulder extension 5/5 5/5  Shoulder abduction 4/5 4/5  Shoulder adduction 5/5 5/5      Shoulder internal rotation 5/5 5/5  Shoulder external rotation 5/5 5/5  Middle trapezius 5/5 5/5  Lower trapezius    Elbow flexion 5/5 5/5  Elbow extension 5/5 5/5  Wrist flexion    Wrist extension    Wrist ulnar deviation    Wrist radial deviation    Wrist pronation    Wrist supination    Grip strength TBD TBD   (Blank rows = not tested)  CERVICAL SPECIAL  TESTS:  NT  FUNCTIONAL TESTS:  NT  TODAY'S TREATMENT:                                                                                                                              DATE: 12/04/2022   SUBJECTIVE: Pt reports that he is doing well today. He continues to exercise with his trainer, Beth, and denies any neck or shoulder pain currently. No specific questions or concerns currently.    PAIN: Denies   Ther-ex  UBE x 4 minutes for warm-up during interval history (2 min forward/2 min backwards); Supine sternal and clavicular pec stretches with rolled towel along long axis of spine x 1 minutes each; Supine thoracic extension stretch with rolled towel placed perpendicular to spine at mid thoracic region and arms stretched overhead x 1 minute; Supine repeated cervical retractions with rolled towel placed perpendicular to spine at mid thoracic region 2 x 10; Sidelying open book thoracic rotations (cervical rotation encouraged) x 10 toward each direction; Sidelying horizontal abduction strengthening with 3# dumbbell 2 x 10 on each side; Nautilus rows 40# 2 x 10; NDI: 8%; FOTO: 99, predicted decline to 95   PATIENT EDUCATION:  Education details: Access Code: [email or web address] Person educated: Patient and Spouse Education method: Explanation, Demonstration, and Handouts Education comprehension: verbalized understanding and returned demonstration  HOME EXERCISE  PROGRAM: Access Code: [email or web address] URL: [email or web address] Date: 12/04/2022 Prepared by: Ria Comment  Exercises - Supine Cervical Retraction with Towel  - 2 x daily - 7 x weekly - 1 sets - 5 reps - Supine Shoulder Flexion with Dowel AAROM - Palms Up  - 2 x daily - 7 x weekly - 1 sets - 5 reps - Supine Chest Stretch with Elbows Bent  - 2 x daily - 7 x weekly - 1 sets - 5 reps - Supine Thoracic Mobilization Towel Roll Horizontal with Arm Stretch  - 2 x daily - 7 x weekly - 3 reps - 60s hold   ASSESSMENT:  CLINICAL  IMPRESSION: Patient demonstrates excellent motivation during session. Progressed thoracic extension and postural correction. No radicular symptoms during tx. session. Updated HEP and pt encouraged to follow-up as scheduled. He will benefit from short-term skilled PT services to increase cervical AROM/ upright posture to progress to gym based ex. with personal trainer.   OBJECTIVE IMPAIRMENTS: decreased activity tolerance, decreased endurance, decreased mobility, decreased ROM, decreased strength, hypomobility, impaired flexibility, impaired UE functional use, improper body mechanics, postural dysfunction, and pain.   ACTIVITY LIMITATIONS: carrying, lifting, standing, and locomotion level  PARTICIPATION LIMITATIONS: driving, community activity, and yard work  PERSONAL FACTORS: Past/current experiences are also affecting patient's functional outcome.   REHAB POTENTIAL: Good  CLINICAL DECISION MAKING: Stable/uncomplicated  EVALUATION COMPLEXITY: Low   GOALS: Goals reviewed with patient? Yes  SHORT TERM GOALS: Target date: 12/18/22  Pt. Independent with gym based/ HEP to increase B shoulder flexion/ abducton 1/2 muscle grade to improve UE mobility.  Baseline:  See above Goal status: INITIAL   LONG TERM GOALS: Target date: 01/15/23  Pt. Will complete FOTO and improve to goal status in 8 weeks.  Baseline: 12/04/22: 99, predicted to decline to 95. Goal status: DISCONTINUED  2.  Pt. Will demonstrate proper chin tucks in supine/ seated posture with no increase in neck pain to improve posture.  Baseline: moderate verbal/tactile cuing  Goal status:  INITIAL  3.  Pt. Able to maintain proper upright posture/ head position for 30 minutes while working on car in garage.   Baseline: <5 minutes (reported by wife) Goal status: INITIAL  4.  Pt. Will increase cervical lateral flexion to >30 deg. To improve pain-free mobility.   Baseline:  12 deg. L/R (significant stiffness) Goal status:  INITIAL   PLAN:  PT FREQUENCY: 1x/week  PT DURATION: 8 weeks  PLANNED INTERVENTIONS: Therapeutic exercises, Therapeutic activity, Neuromuscular re-education, Gait training, Patient/Family education, Self Care, Joint mobilization, Dry Needling, Spinal mobilization, Cryotherapy, Moist heat, Traction, Manual therapy, and Re-evaluation  PLAN FOR NEXT SESSION: Pec stretches/ thoracic extension/ discuss appt.with personal trainer [employer]).   Sharalyn Ink Latima Hamza PT, DPT, GCS  12/06/2022, 5:49 PM

## 2022-12-04 ENCOUNTER — Ambulatory Visit: Payer: PPO | Attending: Internal Medicine

## 2022-12-04 DIAGNOSIS — M6281 Muscle weakness (generalized): Secondary | ICD-10-CM | POA: Diagnosis not present

## 2022-12-04 DIAGNOSIS — R293 Abnormal posture: Secondary | ICD-10-CM

## 2022-12-11 ENCOUNTER — Ambulatory Visit: Payer: PPO

## 2022-12-11 DIAGNOSIS — R293 Abnormal posture: Secondary | ICD-10-CM | POA: Diagnosis not present

## 2022-12-11 DIAGNOSIS — M6281 Muscle weakness (generalized): Secondary | ICD-10-CM

## 2022-12-11 NOTE — Therapy (Signed)
OUTPATIENT PHYSICAL THERAPY CERVICALTREATMENT   Patient Name: Frank Gonzalez MRN: [id number] DOB:01/20/1941, 82 y.o., male Today's Date: 12/11/2022  END OF SESSION:  PT End of Session - 12/11/22 1016     Visit Number 3    Number of Visits 8    Date for PT Re-Evaluation 01/15/23    PT Start Time 1016    PT Stop Time 1100    PT Time Calculation (min) 44 min    Activity Tolerance Patient tolerated treatment well    Behavior During Therapy [facility] for tasks assessed/performed            Past Medical History:  Diagnosis Date   Anal fissure    Cancer (HCC)    prostate   Diverticulitis large intestine    GERD (gastroesophageal reflux disease)    Inguinal hernia    Meniere disease    RLS (restless legs syndrome)    Schatzki's ring    Past Surgical History:  Procedure Laterality Date   ANAL FISSURE REPAIR  2004   COLONOSCOPY WITH PROPOFOL N/A 09/25/2015   Procedure: COLONOSCOPY WITH PROPOFOL;  Surgeon: Scot Jun, MD;  Location: [facility] ENDOSCOPY;  Service: Endoscopy;  Laterality: N/A;   HERNIA REPAIR     PROSTATECTOMY  2002   There are no problems to display for this patient.   PCP: Danella Penton, MD  REFERRING PROVIDER: Danella Penton, MD  REFERRING DIAG: Upper extremity muscle weakness    Kyphosis  THERAPY DIAG:  Abnormal posture  Muscle weakness (generalized)  Rationale for Evaluation and Treatment: Rehabilitation  ONSET DATE: 08/03/2016  (chronic)  FROM INITIAL EVALUATION SUBJECTIVE:                                                                                                                                                                                                         SUBJECTIVE STATEMENT: Pt./wife report worsening of thoracic kyphotic posture/ cervical flexion with standing tasks. Pts. Wife states her husband walks around looking at floor.  Pt. Able to correct head/upright position with cuing but returns to flexed posture.  Pt. Attends gym  ([employer]) 2x/week with personal trainer, Beth, to work on generalized strengthening/ conditioning.  Pt. Using TM with light UE assist for safety.  No c/o neck pain at this time.    Hand dominance: Right  PERTINENT HISTORY:  Pt. Known to PT clinic.  Pts. wife, Bonita Quin, present to assist with PMHx.    PAIN:  Are you having pain? No  PRECAUTIONS: None  WEIGHT BEARING RESTRICTIONS: No  FALLS:  Has  patient fallen in last 6 months? No  LIVING ENVIRONMENT: Lives with: lives with their spouse Lives in: House/apartment Has following equipment at home: None  OCCUPATION: Retired  PLOF: Independent  PATIENT GOALS: Increase neck/UE muscle strength and improve posture.  NEXT MD VISIT: PRN  OBJECTIVE:   DIAGNOSTIC FINDINGS:  No recent imaging in chart  PATIENT SURVEYS:  NDI :TBD FOTO :TBD  COGNITION: Overall cognitive status: Within functional limits for tasks assessed  SENSATION: WFL  POSTURE: rounded shoulders, forward head, and increased thoracic kyphosis  PALPATION: No tenderness with palpation along cervical/thoracic paraspinals.  Tightness noted in B pec musculature.   CERVICAL ROM:  Seated position  Active ROM A/PROM (deg) eval  Flexion 69 deg  Extension 15 deg  Right lateral flexion 12 deg.  Left lateral flexion 12 deg.  Right rotation 55 deg.  Left rotation 52 deg.   (Blank rows = not tested)  UPPER EXTREMITY ROM:  B UE AROM WFL.  No shoulder/elbow limitations noted.    UPPER EXTREMITY MMT:  MMT Right eval Left eval  Shoulder flexion 4+/5 4+/5  Shoulder extension 5/5 5/5  Shoulder abduction 4/5 4/5  Shoulder adduction 5/5 5/5      Shoulder internal rotation 5/5 5/5  Shoulder external rotation 5/5 5/5  Middle trapezius 5/5 5/5  Lower trapezius    Elbow flexion 5/5 5/5  Elbow extension 5/5 5/5  Wrist flexion    Wrist extension    Wrist ulnar deviation    Wrist radial deviation    Wrist pronation    Wrist supination    Grip strength  TBD TBD   (Blank rows = not tested)  CERVICAL SPECIAL TESTS:  NT  FUNCTIONAL TESTS:  NT  TODAY'S TREATMENT:                                                                                                                              DATE: 12/11/2022   SUBJECTIVE: Pt reports that he is doing well today. He continues to exercise with his trainer, Beth, but they only had one session this week. No pain reported upon arrival and HEP is going well. His wife continues to give him postural cues. No specific questions or concerns currently.    PAIN: Denies   Ther-ex  UBE x 4 minutes for warm-up during interval history (2 min forward/2 min backwards); Supine sternal and clavicular pec stretches with rolled towel along long axis of spine 2 x 1 minutes each; Supine horizontal abduction/reverse fly with green tband and rolled towel along long axis of spine 2 x 10; Supine thoracic extension stretch with rolled towel placed perpendicular to spine at mid thoracic region and arms stretched overhead 2 x 1 minute; Supine repeated cervical retractions with rolled towel placed perpendicular to spine at mid thoracic region 2 x 10; Sidelying open book thoracic rotations (cervical rotation encouraged) x 10 toward each direction; Sidelying thoracic rotation strengthening with green tband x 10 on each  side; Nautilus lat pull down 80# x 10, 95# x 10; Nautilus rows 50# x 10, 60# x 10;   PATIENT EDUCATION:  Education details: Access Code: [email or web address] Person educated: Patient and Spouse Education method: Explanation, Demonstration, and Handouts Education comprehension: verbalized understanding and returned demonstration  HOME EXERCISE PROGRAM: Access Code: [email or web address] URL: [email or web address] Date: 12/04/2022 Prepared by: Ria Comment  Exercises - Supine Cervical Retraction with Towel  - 2 x daily - 7 x weekly - 1 sets - 5 reps - Supine Shoulder Flexion with Dowel AAROM - Palms Up  - 2 x  daily - 7 x weekly - 1 sets - 5 reps - Supine Chest Stretch with Elbows Bent  - 2 x daily - 7 x weekly - 1 sets - 5 reps - Supine Thoracic Mobilization Towel Roll Horizontal with Arm Stretch  - 2 x daily - 7 x weekly - 3 reps - 60s hold   ASSESSMENT:  CLINICAL IMPRESSION: Patient demonstrates excellent motivation during session. Continued with thoracic extension and postural correction exercises. He denies any pain throughout entire session and reports being able to localize stretches appropriately. Pt encouraged to continue HEP and follow-up as scheduled. No modifications made today to his home program. He will benefit from short-term skilled PT services to increase cervical AROM/ upright posture to progress to gym based ex. with personal trainer.   OBJECTIVE IMPAIRMENTS: decreased activity tolerance, decreased endurance, decreased mobility, decreased ROM, decreased strength, hypomobility, impaired flexibility, impaired UE functional use, improper body mechanics, postural dysfunction, and pain.   ACTIVITY LIMITATIONS: carrying, lifting, standing, and locomotion level  PARTICIPATION LIMITATIONS: driving, community activity, and yard work  PERSONAL FACTORS: Past/current experiences are also affecting patient's functional outcome.   REHAB POTENTIAL: Good  CLINICAL DECISION MAKING: Stable/uncomplicated  EVALUATION COMPLEXITY: Low   GOALS: Goals reviewed with patient? Yes  SHORT TERM GOALS: Target date: 12/18/22  Pt. Independent with gym based/ HEP to increase B shoulder flexion/ abducton 1/2 muscle grade to improve UE mobility.  Baseline:  See above Goal status: INITIAL   LONG TERM GOALS: Target date: 01/15/23  Pt. Will complete FOTO and improve to goal status in 8 weeks.  Baseline: 12/04/22: 99, predicted to decline to 95. Goal status: DISCONTINUED  2.  Pt. Will demonstrate proper chin tucks in supine/ seated posture with no increase in neck pain to improve posture.  Baseline:  moderate verbal/tactile cuing  Goal status:  INITIAL  3.  Pt. Able to maintain proper upright posture/ head position for 30 minutes while working on car in garage.   Baseline: <5 minutes (reported by wife) Goal status: INITIAL  4.  Pt. Will increase cervical lateral flexion to >30 deg. To improve pain-free mobility.   Baseline:  12 deg. L/R (significant stiffness) Goal status: INITIAL   PLAN:  PT FREQUENCY: 1x/week  PT DURATION: 8 weeks  PLANNED INTERVENTIONS: Therapeutic exercises, Therapeutic activity, Neuromuscular re-education, Gait training, Patient/Family education, Self Care, Joint mobilization, Dry Needling, Spinal mobilization, Cryotherapy, Moist heat, Traction, Manual therapy, and Re-evaluation  PLAN FOR NEXT SESSION: Pec stretches/ thoracic extension/ discuss appt.with personal trainer [employer]).   Sharalyn Ink Garrick Midgley PT, DPT, GCS  12/11/2022, 11:43 AM

## 2022-12-17 NOTE — Therapy (Signed)
OUTPATIENT PHYSICAL THERAPY CERVICALTREATMENT   Patient Name: Frank Gonzalez MRN: [id number] DOB:[birth year], 82 y.o., male Today's Date: 12/18/2022  END OF SESSION:  PT End of Session - 12/18/22 0906     Visit Number 4    Number of Visits 8    Date for PT Re-Evaluation 01/15/23    PT Start Time 0913    PT Stop Time 0958    PT Time Calculation (min) 45 min    Activity Tolerance Patient tolerated treatment well    Behavior During Therapy [facility] for tasks assessed/performed            Past Medical History:  Diagnosis Date   Anal fissure    Cancer (HCC)    prostate   Diverticulitis large intestine    GERD (gastroesophageal reflux disease)    Inguinal hernia    Meniere disease    RLS (restless legs syndrome)    Schatzki's ring    Past Surgical History:  Procedure Laterality Date   ANAL FISSURE REPAIR  2004   COLONOSCOPY WITH PROPOFOL N/A 09/25/2015   Procedure: COLONOSCOPY WITH PROPOFOL;  Surgeon: Scot Jun, MD;  Location: [facility] ENDOSCOPY;  Service: Endoscopy;  Laterality: N/A;   HERNIA REPAIR     PROSTATECTOMY  2002   There are no problems to display for this patient.   PCP: Danella Penton, MD  REFERRING PROVIDER: Danella Penton, MD  REFERRING DIAG: Upper extremity muscle weakness    Kyphosis  THERAPY DIAG:  Muscle weakness (generalized)  Abnormal posture  Rationale for Evaluation and Treatment: Rehabilitation  ONSET DATE: 08/03/2016  (chronic)  FROM INITIAL EVALUATION SUBJECTIVE:                                                                                                                                                                                                         SUBJECTIVE STATEMENT: Pt./wife report worsening of thoracic kyphotic posture/ cervical flexion with standing tasks. Pts. Wife states her husband walks around looking at floor.  Pt. Able to correct head/upright position with cuing but returns to flexed posture.  Pt. Attends gym  ([employer]) 2x/week with personal trainer, Beth, to work on generalized strengthening/ conditioning.  Pt. Using TM with light UE assist for safety.  No c/o neck pain at this time.    Hand dominance: Right  PERTINENT HISTORY:  Pt. Known to PT clinic.  Pts. wife, Bonita Quin, present to assist with PMHx.    PAIN:  Are you having pain? No  PRECAUTIONS: None  WEIGHT BEARING RESTRICTIONS: No  FALLS:  Has  patient fallen in last 6 months? No  LIVING ENVIRONMENT: Lives with: lives with their spouse Lives in: House/apartment Has following equipment at home: None  OCCUPATION: Retired  PLOF: Independent  PATIENT GOALS: Increase neck/UE muscle strength and improve posture.  NEXT MD VISIT: PRN  OBJECTIVE:   DIAGNOSTIC FINDINGS:  No recent imaging in chart  PATIENT SURVEYS:  NDI :TBD FOTO :TBD  COGNITION: Overall cognitive status: Within functional limits for tasks assessed  SENSATION: WFL  POSTURE: rounded shoulders, forward head, and increased thoracic kyphosis  PALPATION: No tenderness with palpation along cervical/thoracic paraspinals.  Tightness noted in B pec musculature.   CERVICAL ROM:  Seated position  Active ROM A/PROM (deg) eval  Flexion 69 deg  Extension 15 deg  Right lateral flexion 12 deg.  Left lateral flexion 12 deg.  Right rotation 55 deg.  Left rotation 52 deg.   (Blank rows = not tested)  UPPER EXTREMITY ROM:  B UE AROM WFL.  No shoulder/elbow limitations noted.    UPPER EXTREMITY MMT:  MMT Right eval Left eval  Shoulder flexion 4+/5 4+/5  Shoulder extension 5/5 5/5  Shoulder abduction 4/5 4/5  Shoulder adduction 5/5 5/5      Shoulder internal rotation 5/5 5/5  Shoulder external rotation 5/5 5/5  Middle trapezius 5/5 5/5  Lower trapezius    Elbow flexion 5/5 5/5  Elbow extension 5/5 5/5  Wrist flexion    Wrist extension    Wrist ulnar deviation    Wrist radial deviation    Wrist pronation    Wrist supination    Grip strength  TBD TBD   (Blank rows = not tested)  CERVICAL SPECIAL TESTS:  NT  FUNCTIONAL TESTS:  NT  TODAY'S TREATMENT:                                                                                                                              DATE: 12/18/2022   SUBJECTIVE: Pt reports that he is doing well today. He did not work with his Chartered loss adjuster this week. He has been performing his HEP and believes that his posture has been getting slightly better. He reports that his wife has stated that his posture is looking better but she also "continues to yell at me when I am looking down." No pain reported upon arrival. No specific questions currently.   PAIN: Denies   Ther-ex  UBE x 5 minutes for warm-up during interval history (2.5 min forward/2.5 min backwards); Supine sternal and clavicular pec stretches with rolled towel along long axis of spine 2 x 1 minutes each; Supine horizontal abduction/reverse fly with green tband and rolled towel along long axis of spine 2 x 15; Supine repeated cervical retractions with rolled towel along long axis of spine 2 x 10; Supine thoracic extension stretch with rolled towel placed perpendicular to spine at mid thoracic region and arms stretched overhead 2 x 1 minute; Sidelying open book thoracic rotations (cervical rotation  encouraged) along with brief overpressure stretch x 10 toward each direction; Sidelying thoracic rotation strengthening with blue tband x 10 on each side; Nautilus lat pull down 95# x 10, 110# x 10, 3s stretch/hold for stretch at top between each rep; Nautilus rows 60# 2 x 10, verbal and tactile cues for scapular retraction and posture;   PATIENT EDUCATION:  Education details: Access Code: [email or web address] Person educated: Patient and Spouse Education method: Explanation, Demonstration, and Handouts Education comprehension: verbalized understanding and returned demonstration  HOME EXERCISE PROGRAM: Access Code: [email or web address] URL:  [email or web address] Date: 12/04/2022 Prepared by: Ria Comment  Exercises - Supine Cervical Retraction with Towel  - 2 x daily - 7 x weekly - 1 sets - 5 reps - Supine Shoulder Flexion with Dowel AAROM - Palms Up  - 2 x daily - 7 x weekly - 1 sets - 5 reps - Supine Chest Stretch with Elbows Bent  - 2 x daily - 7 x weekly - 1 sets - 5 reps - Supine Thoracic Mobilization Towel Roll Horizontal with Arm Stretch  - 2 x daily - 7 x weekly - 3 reps - 60s hold   ASSESSMENT:  CLINICAL IMPRESSION: Patient demonstrates excellent motivation during session. Continued with thoracic extension and postural correction exercises. Progressed weight and resistance during strengthening. He denies any pain throughout entire session and reports being able to localize stretches appropriately. Cues provided throughout session for posture. Pt encouraged to continue HEP and follow-up as scheduled. No modifications made today to his home exercise program. Discussed beneficial exercises he can do at the gym. He will benefit from short-term skilled PT services to increase cervical AROM/upright posture to progress to gym based ex. with personal trainer.   OBJECTIVE IMPAIRMENTS: decreased activity tolerance, decreased endurance, decreased mobility, decreased ROM, decreased strength, hypomobility, impaired flexibility, impaired UE functional use, improper body mechanics, postural dysfunction, and pain.   ACTIVITY LIMITATIONS: carrying, lifting, standing, and locomotion level  PARTICIPATION LIMITATIONS: driving, community activity, and yard work  PERSONAL FACTORS: Past/current experiences are also affecting patient's functional outcome.   REHAB POTENTIAL: Good  CLINICAL DECISION MAKING: Stable/uncomplicated  EVALUATION COMPLEXITY: Low   GOALS: Goals reviewed with patient? Yes  SHORT TERM GOALS: Target date: 12/18/22  Pt. Independent with gym based/ HEP to increase B shoulder flexion/ abducton 1/2  muscle grade to improve UE mobility.  Baseline:  See above Goal status: INITIAL   LONG TERM GOALS: Target date: 01/15/23  Pt. Will complete FOTO and improve to goal status in 8 weeks.  Baseline: 12/04/22: 99, predicted to decline to 95. Goal status: DISCONTINUED  2.  Pt. Will demonstrate proper chin tucks in supine/ seated posture with no increase in neck pain to improve posture.  Baseline: moderate verbal/tactile cuing  Goal status:  INITIAL  3.  Pt. Able to maintain proper upright posture/ head position for 30 minutes while working on car in garage.   Baseline: <5 minutes (reported by wife) Goal status: INITIAL  4.  Pt. Will increase cervical lateral flexion to >30 deg. To improve pain-free mobility.   Baseline:  12 deg. L/R (significant stiffness) Goal status: INITIAL   PLAN:  PT FREQUENCY: 1x/week  PT DURATION: 8 weeks  PLANNED INTERVENTIONS: Therapeutic exercises, Therapeutic activity, Neuromuscular re-education, Gait training, Patient/Family education, Self Care, Joint mobilization, Dry Needling, Spinal mobilization, Cryotherapy, Moist heat, Traction, Manual therapy, and Re-evaluation  PLAN FOR NEXT SESSION: Pec stretches/ thoracic extension/ discuss appt.with personal trainer [employer]).   Sharalyn Ink Khani Paino PT, DPT, GCS  12/18/2022, 10:11  AM

## 2022-12-18 ENCOUNTER — Ambulatory Visit: Payer: PPO

## 2022-12-18 DIAGNOSIS — R293 Abnormal posture: Secondary | ICD-10-CM | POA: Diagnosis not present

## 2022-12-18 DIAGNOSIS — M6281 Muscle weakness (generalized): Secondary | ICD-10-CM

## 2022-12-23 ENCOUNTER — Ambulatory Visit: Payer: PPO | Admitting: Physical Therapy

## 2022-12-31 DIAGNOSIS — E782 Mixed hyperlipidemia: Secondary | ICD-10-CM | POA: Diagnosis not present

## 2022-12-31 DIAGNOSIS — H5010 Unspecified exotropia: Secondary | ICD-10-CM | POA: Diagnosis not present

## 2022-12-31 DIAGNOSIS — H26491 Other secondary cataract, right eye: Secondary | ICD-10-CM | POA: Diagnosis not present

## 2023-01-03 NOTE — Therapy (Signed)
OUTPATIENT PHYSICAL THERAPY CERVICALTREATMENT  Patient Name: Frank Gonzalez MRN: [id number] DOB:1941/06/05, 82 y.o., male Today's Date: 01/05/2023  END OF SESSION:  PT End of Session - 01/05/23 1019     Visit Number 5    Number of Visits 8    Date for PT Re-Evaluation 01/15/23    PT Start Time 1019    PT Stop Time 1100    PT Time Calculation (min) 41 min    Activity Tolerance Patient tolerated treatment well    Behavior During Therapy [facility] for tasks assessed/performed            Past Medical History:  Diagnosis Date   Anal fissure    Cancer (HCC)    prostate   Diverticulitis large intestine    GERD (gastroesophageal reflux disease)    Inguinal hernia    Meniere disease    RLS (restless legs syndrome)    Schatzki's ring    Past Surgical History:  Procedure Laterality Date   ANAL FISSURE REPAIR  2004   COLONOSCOPY WITH PROPOFOL N/A 09/25/2015   Procedure: COLONOSCOPY WITH PROPOFOL;  Surgeon: Scot Jun, MD;  Location: [facility] ENDOSCOPY;  Service: Endoscopy;  Laterality: N/A;   HERNIA REPAIR     PROSTATECTOMY  2002   There are no problems to display for this patient.   PCP: Danella Penton, MD  REFERRING PROVIDER: Danella Penton, MD  REFERRING DIAG: Upper extremity muscle weakness    Kyphosis  THERAPY DIAG:  Muscle weakness (generalized)  Abnormal posture  Rationale for Evaluation and Treatment: Rehabilitation  ONSET DATE: 08/03/2016  (chronic)  FROM INITIAL EVALUATION SUBJECTIVE:                                                                                                                                                                                                         SUBJECTIVE STATEMENT: Pt./wife report worsening of thoracic kyphotic posture/ cervical flexion with standing tasks. Pts. Wife states her husband walks around looking at floor.  Pt. Able to correct head/upright position with cuing but returns to flexed posture.  Pt. Attends gym  ([employer]) 2x/week with personal trainer, Beth, to work on generalized strengthening/ conditioning.  Pt. Using TM with light UE assist for safety.  No c/o neck pain at this time.    Hand dominance: Right  PERTINENT HISTORY:  Pt. Known to PT clinic.  Pts. wife, Frank Gonzalez, present to assist with PMHx.    PAIN:  Are you having pain? No  PRECAUTIONS: None  WEIGHT BEARING RESTRICTIONS: No  FALLS:  Has patient  fallen in last 6 months? No  LIVING ENVIRONMENT: Lives with: lives with their spouse Lives in: House/apartment Has following equipment at home: None  OCCUPATION: Retired  PLOF: Independent  PATIENT GOALS: Increase neck/UE muscle strength and improve posture.  NEXT MD VISIT: PRN  OBJECTIVE:   DIAGNOSTIC FINDINGS:  No recent imaging in chart  PATIENT SURVEYS:  NDI :TBD FOTO :TBD  COGNITION: Overall cognitive status: Within functional limits for tasks assessed  SENSATION: WFL  POSTURE: rounded shoulders, forward head, and increased thoracic kyphosis  PALPATION: No tenderness with palpation along cervical/thoracic paraspinals.  Tightness noted in B pec musculature.   CERVICAL ROM:  Seated position  Active ROM A/PROM (deg) eval  Flexion 69 deg  Extension 15 deg  Right lateral flexion 12 deg.  Left lateral flexion 12 deg.  Right rotation 55 deg.  Left rotation 52 deg.   (Blank rows = not tested)  UPPER EXTREMITY ROM:  B UE AROM WFL.  No shoulder/elbow limitations noted.    UPPER EXTREMITY MMT:  MMT Right eval Left eval  Shoulder flexion 4+/5 4+/5  Shoulder extension 5/5 5/5  Shoulder abduction 4/5 4/5  Shoulder adduction 5/5 5/5      Shoulder internal rotation 5/5 5/5  Shoulder external rotation 5/5 5/5  Middle trapezius 5/5 5/5  Lower trapezius    Elbow flexion 5/5 5/5  Elbow extension 5/5 5/5  Wrist flexion    Wrist extension    Wrist ulnar deviation    Wrist radial deviation    Wrist pronation    Wrist supination    Grip strength  TBD TBD   (Blank rows = not tested)  CERVICAL SPECIAL TESTS:  NT  FUNCTIONAL TESTS:  NT  TODAY'S TREATMENT:                                                                                                                                SUBJECTIVE: Pt reports that he is doing well today. He worked with his Chartered loss adjuster yesterday and performed a lot of back exercises. He felt good after his last therapy session and believes that therapy has been helpful. No pain reported upon arrival.    PAIN: Denies   Ther-ex  UBE x 5 minutes for warm-up during interval history (3 min forward/2 min backwards); Supine sternal and clavicular pec stretches with rolled towel along long axis of spine 2 x 1 minutes each, utilized cane with 5# ankle weight overhead during sternal head stretch; ; Supine horizontal abduction/reverse fly with blue tband and rolled towel along long axis of spine 2 x 15; Supine repeated cervical retractions with rolled towel along long axis of spine and overpressure from therpaist 2 x 10; Supine thoracic extension stretch with rolled towel placed perpendicular to spine at mid thoracic region and arms stretched overhead 2 x 1 minute; Sidelying open book thoracic rotations (cervical rotation encouraged) along with brief overpressure stretch x 10 toward each direction; Sidelying thoracic  rotation strengthening with blue tband 2 x 10 on each side; Seated blue pball roll outs x 10; Seated blue tband rows x 10, verbal cues for scapular retraction; Seated scapular retractions x 10;   Not performed: Nautilus lat pull down 95# x 10, 110# x 10, 3s stretch/hold for stretch at top between each rep; Nautilus rows 60# 2 x 10, verbal and tactile cues for scapular retraction and posture;   PATIENT EDUCATION:  Education details: Pt educated throughout session about proper posture and technique with exercises. Improved exercise technique, movement at target joints, use of target muscles after  min to mod verbal, visual, tactile cues.  Person educated: Patient and Spouse Education method: Explanation, Demonstration, and Handouts Education comprehension: verbalized understanding and returned demonstration  HOME EXERCISE PROGRAM: Access Code: [email or web address] URL: [email or web address] Date: 12/04/2022 Prepared by: Ria Comment  Exercises - Supine Cervical Retraction with Towel  - 2 x daily - 7 x weekly - 1 sets - 5 reps - Supine Shoulder Flexion with Dowel AAROM - Palms Up  - 2 x daily - 7 x weekly - 1 sets - 5 reps - Supine Chest Stretch with Elbows Bent  - 2 x daily - 7 x weekly - 1 sets - 5 reps - Supine Thoracic Mobilization Towel Roll Horizontal with Arm Stretch  - 2 x daily - 7 x weekly - 3 reps - 60s hold   ASSESSMENT:  CLINICAL IMPRESSION: Patient demonstrates excellent motivation during session. Continued with thoracic extension and postural correction exercises. He denies any pain throughout entire session and reports being able to localize stretches appropriately. Cues provided throughout session for posture. Pt encouraged to continue HEP and follow-up as scheduled. No modifications made today to his home exercise program. Patient was supposed to give therapist's card with contact information to trainer in order for her to initiate contact. He will benefit from short-term skilled PT services to increase cervical AROM/upright posture to progress to gym based ex. with personal trainer.   OBJECTIVE IMPAIRMENTS: decreased activity tolerance, decreased endurance, decreased mobility, decreased ROM, decreased strength, hypomobility, impaired flexibility, impaired UE functional use, improper body mechanics, postural dysfunction, and pain.   ACTIVITY LIMITATIONS: carrying, lifting, standing, and locomotion level  PARTICIPATION LIMITATIONS: driving, community activity, and yard work  PERSONAL FACTORS: Past/current experiences are also affecting patient's functional  outcome.   REHAB POTENTIAL: Good  CLINICAL DECISION MAKING: Stable/uncomplicated  EVALUATION COMPLEXITY: Low   GOALS: Goals reviewed with patient? Yes  SHORT TERM GOALS: Target date: 12/18/22  Pt. Independent with gym based/ HEP to increase B shoulder flexion/ abducton 1/2 muscle grade to improve UE mobility.  Baseline:  See above Goal status: INITIAL   LONG TERM GOALS: Target date: 01/15/23  Pt. Will complete FOTO and improve to goal status in 8 weeks.  Baseline: 12/04/22: 99, predicted to decline to 95. Goal status: DISCONTINUED  2.  Pt. Will demonstrate proper chin tucks in supine/ seated posture with no increase in neck pain to improve posture.  Baseline: moderate verbal/tactile cuing  Goal status:  INITIAL  3.  Pt. Able to maintain proper upright posture/ head position for 30 minutes while working on car in garage.   Baseline: <5 minutes (reported by wife) Goal status: INITIAL  4.  Pt. Will increase cervical lateral flexion to >30 deg. To improve pain-free mobility.   Baseline:  12 deg. L/R (significant stiffness) Goal status: INITIAL   PLAN:  PT FREQUENCY: 1x/week  PT DURATION: 8 weeks  PLANNED INTERVENTIONS: Therapeutic exercises, Therapeutic activity,  Neuromuscular re-education, Gait training, Patient/Family education, Self Care, Joint mobilization, Dry Needling, Spinal mobilization, Cryotherapy, Moist heat, Traction, Manual therapy, and Re-evaluation  PLAN FOR NEXT SESSION: Pec stretches/ thoracic extension/ discuss appt.with personal trainer [employer] broker).   Sharalyn Ink Tarini Carrier PT, DPT, GCS  01/05/2023, 3:05 PM

## 2023-01-05 ENCOUNTER — Ambulatory Visit: Payer: PPO | Attending: Internal Medicine

## 2023-01-05 DIAGNOSIS — M6281 Muscle weakness (generalized): Secondary | ICD-10-CM | POA: Insufficient documentation

## 2023-01-05 DIAGNOSIS — R293 Abnormal posture: Secondary | ICD-10-CM | POA: Insufficient documentation

## 2023-01-08 DIAGNOSIS — Z0000 Encounter for general adult medical examination without abnormal findings: Secondary | ICD-10-CM | POA: Diagnosis not present

## 2023-01-08 DIAGNOSIS — E782 Mixed hyperlipidemia: Secondary | ICD-10-CM | POA: Diagnosis not present

## 2023-01-08 DIAGNOSIS — F330 Major depressive disorder, recurrent, mild: Secondary | ICD-10-CM | POA: Diagnosis not present

## 2023-01-08 DIAGNOSIS — H532 Diplopia: Secondary | ICD-10-CM | POA: Diagnosis not present

## 2023-01-08 DIAGNOSIS — F01A0 Vascular dementia, mild, without behavioral disturbance, psychotic disturbance, mood disturbance, and anxiety: Secondary | ICD-10-CM | POA: Diagnosis not present

## 2023-01-08 DIAGNOSIS — I63412 Cerebral infarction due to embolism of left middle cerebral artery: Secondary | ICD-10-CM | POA: Diagnosis not present

## 2023-01-08 DIAGNOSIS — I10 Essential (primary) hypertension: Secondary | ICD-10-CM | POA: Diagnosis not present

## 2023-01-08 DIAGNOSIS — Z125 Encounter for screening for malignant neoplasm of prostate: Secondary | ICD-10-CM | POA: Diagnosis not present

## 2023-01-08 DIAGNOSIS — D692 Other nonthrombocytopenic purpura: Secondary | ICD-10-CM | POA: Diagnosis not present

## 2023-01-11 ENCOUNTER — Other Ambulatory Visit: Payer: Self-pay | Admitting: Internal Medicine

## 2023-01-11 DIAGNOSIS — I63412 Cerebral infarction due to embolism of left middle cerebral artery: Secondary | ICD-10-CM

## 2023-01-11 DIAGNOSIS — H532 Diplopia: Secondary | ICD-10-CM

## 2023-01-12 ENCOUNTER — Ambulatory Visit
Admission: RE | Admit: 2023-01-12 | Discharge: 2023-01-12 | Disposition: A | Discharge status: Home / Self Care | Payer: PPO | Source: Ambulatory Visit | Attending: Internal Medicine | Admitting: Internal Medicine

## 2023-01-12 DIAGNOSIS — I63412 Cerebral infarction due to embolism of left middle cerebral artery: Secondary | ICD-10-CM | POA: Diagnosis not present

## 2023-01-12 DIAGNOSIS — I6782 Cerebral ischemia: Secondary | ICD-10-CM | POA: Diagnosis not present

## 2023-01-12 DIAGNOSIS — H532 Diplopia: Secondary | ICD-10-CM | POA: Diagnosis not present

## 2023-01-19 ENCOUNTER — Ambulatory Visit: Payer: PPO | Admitting: Physical Therapy

## 2023-01-19 DIAGNOSIS — M6281 Muscle weakness (generalized): Secondary | ICD-10-CM

## 2023-01-19 DIAGNOSIS — R293 Abnormal posture: Secondary | ICD-10-CM

## 2023-01-19 DIAGNOSIS — M2560 Stiffness of unspecified joint, not elsewhere classified: Secondary | ICD-10-CM

## 2023-01-20 DIAGNOSIS — H26491 Other secondary cataract, right eye: Secondary | ICD-10-CM | POA: Diagnosis not present

## 2023-02-01 NOTE — Therapy (Signed)
OUTPATIENT PHYSICAL THERAPY CERVICALTREATMENT/RECERTIFICATION  Patient Name: Frank Gonzalez MRN: [id number] DOB:1941-07-06, 82 y.o., male Today's Date: 02/02/2023  END OF SESSION:  PT End of Session - 02/02/23 0803     Visit Number 6    Number of Visits 17    Date for PT Re-Evaluation 03/30/23    Authorization Type eval: 11/10/22;    PT Start Time 0800    PT Stop Time 0845    PT Time Calculation (min) 45 min    Activity Tolerance Patient tolerated treatment well    Behavior During Therapy [facility] for tasks assessed/performed            Past Medical History:  Diagnosis Date   Anal fissure    Cancer (HCC)    prostate   Diverticulitis large intestine    GERD (gastroesophageal reflux disease)    Inguinal hernia    Meniere disease    RLS (restless legs syndrome)    Schatzki's ring    Past Surgical History:  Procedure Laterality Date   ANAL FISSURE REPAIR  2004   COLONOSCOPY WITH PROPOFOL N/A 09/25/2015   Procedure: COLONOSCOPY WITH PROPOFOL;  Surgeon: Scot Jun, MD;  Location: [facility] ENDOSCOPY;  Service: Endoscopy;  Laterality: N/A;   HERNIA REPAIR     PROSTATECTOMY  2002   There are no problems to display for this patient.   PCP: Danella Penton, MD  REFERRING PROVIDER: Danella Penton, MD  REFERRING DIAG: Upper extremity muscle weakness, Kyphosis  THERAPY DIAG:  Muscle weakness (generalized) - Plan: PT plan of care cert/re-cert  Abnormal posture - Plan: PT plan of care cert/re-cert  Joint stiffness of spine - Plan: PT plan of care cert/re-cert  Rationale for Evaluation and Treatment: Rehabilitation  ONSET DATE: 08/03/2016  (chronic)  FROM INITIAL EVALUATION SUBJECTIVE:                                                                                                                                                                                                         SUBJECTIVE STATEMENT: Pt./wife report worsening of thoracic kyphotic posture/ cervical  flexion with standing tasks. Pts. Wife states her husband walks around looking at floor.  Pt. Able to correct head/upright position with cuing but returns to flexed posture.  Pt. Attends gym ([employer]) 2x/week with personal trainer, Beth, to work on generalized strengthening/ conditioning.  Pt. Using TM with light UE assist for safety.  No c/o neck pain at this time.    Hand dominance: Right  PERTINENT HISTORY:  Pt. Known to PT clinic.  Pts.  wife, Bonita Quin, present to assist with PMHx.    PAIN:  Are you having pain? No  PRECAUTIONS: None  WEIGHT BEARING RESTRICTIONS: No  FALLS:  Has patient fallen in last 6 months? No  LIVING ENVIRONMENT: Lives with: lives with their spouse Lives in: House/apartment Has following equipment at home: None  OCCUPATION: Retired  PLOF: Independent  PATIENT GOALS: Increase neck/UE muscle strength and improve posture.  NEXT MD VISIT: PRN  OBJECTIVE:   DIAGNOSTIC FINDINGS:  No recent imaging in chart  PATIENT SURVEYS:  NDI :TBD FOTO :TBD  COGNITION: Overall cognitive status: Within functional limits for tasks assessed  SENSATION: WFL  POSTURE: rounded shoulders, forward head, and increased thoracic kyphosis  PALPATION: No tenderness with palpation along cervical/thoracic paraspinals.  Tightness noted in B pec musculature.   CERVICAL ROM:   Seated position  Active ROM A/PROM (deg) eval  Flexion 69 deg  Extension 15 deg  Right lateral flexion 12 deg.  Left lateral flexion 12 deg.  Right rotation 55 deg.  Left rotation 52 deg.   (Blank rows = not tested)  UPPER EXTREMITY ROM: B UE AROM WFL.  No shoulder/elbow limitations noted.    UPPER EXTREMITY MMT:  MMT Right eval Left eval  Shoulder flexion 4+/5 4+/5  Shoulder extension 5/5 5/5  Shoulder abduction 4/5 4/5  Shoulder adduction 5/5 5/5      Shoulder internal rotation 5/5 5/5  Shoulder external rotation 5/5 5/5  Middle trapezius 5/5 5/5  Lower trapezius    Elbow  flexion 5/5 5/5  Elbow extension 5/5 5/5  Wrist flexion    Wrist extension    Wrist ulnar deviation    Wrist radial deviation    Wrist pronation    Wrist supination    Grip strength TBD TBD   (Blank rows = not tested)  CERVICAL SPECIAL TESTS:  NT  FUNCTIONAL TESTS:  NT   TODAY'S TREATMENT:                                                                                                                                SUBJECTIVE: Pt reports that he is doing well today. He has not worked with his Chartered loss adjuster recently because she was out of town on vacation. He has been performing his HEP. No pain reported upon arrival. Pt states that he has noticed an improvement in his posture however his wife reports continued poor endurance maintaining his head/neck in an upright position.   PAIN: Denies   Ther-ex  UBE x 5 minutes for warm-up during interval history (2.5 min forward/2.5 min backwards);  Updated outcome measures with patient: Cervical ROM:  CERVICAL ROM:    Active ROM A/PROM (deg) eval  Flexion 76 deg  Extension 24 deg  Right lateral flexion 35 deg.  Left lateral flexion 27 deg.  Right rotation 73 deg.  Left rotation 66 deg.   (Blank rows = not tested)  Supine sternal and clavicular pec  stretches with rolled towel along long axis of spine 2 x 1 minutes each, utilized cane with 5# ankle weight overhead during sternal head stretch; ; Supine horizontal abduction/reverse fly with blue tband and rolled towel along long axis of spine 2 x 15; Supine repeated cervical retractions with rolled towel along long axis of spine and overpressure from therpaist x 10; Supine thoracic extension stretch with rolled towel placed perpendicular to spine at mid thoracic region and arms stretched overhead x 1 minute; Extensive conversation with patient and wife regarding plan of care and possibility of seeing a neurologist to further discuss possible causes for flat affect, hypophonia, and  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;   Not performed: Sidelying open book thoracic rotations (cervical rotation encouraged) along with brief overpressure stretch x 10 toward each direction; Sidelying thoracic rotation strengthening with blue tband 2 x 10 on each side; Seated blue pball roll outs x 10; Seated blue tband rows x 10, verbal cues for scapular retraction; Seated scapular retractions x 10; Nautilus lat pull down 95# x 10, 110# x 10, 3s stretch/hold for stretch at top between each rep; Nautilus rows 60# 2 x 10, verbal and tactile cues for scapular retraction and posture;   PATIENT EDUCATION:  Education details: Pt educated throughout session about proper posture and technique with exercises. Improved exercise technique, movement at target joints, use of target muscles after min to mod verbal, visual, tactile cues.  Person educated: Patient and Spouse Education method: Explanation, Demonstration, and Handouts Education comprehension: verbalized understanding and returned demonstration   HOME EXERCISE PROGRAM: Access Code: [email or web address] URL: [email or web address] Date: 12/04/2022 Prepared by: Ria Comment  Exercises - Supine Cervical Retraction with Towel  - 2 x daily - 7 x weekly - 1 sets - 5 reps - Supine Shoulder Flexion with Dowel AAROM - Palms Up  - 2 x daily - 7 x weekly - 1 sets - 5 reps - Supine Chest Stretch with Elbows Bent  - 2 x daily - 7 x weekly - 1 sets - 5 reps - Supine Thoracic Mobilization Towel Roll Horizontal with Arm Stretch  - 2 x daily - 7 x weekly - 3 reps - 60s hold   ASSESSMENT:  CLINICAL IMPRESSION: Patient demonstrates excellent motivation during session. Updated outcome measures during visit today. Significant improvement noted in cervical range of motion today compared to initial evaluation. Pt reports some improvement in posture however wife reports continued poor cervical endurance and persistent forward/dropped head positioning.  Discussed possibility of dropped head syndrome. Pending PCP recommendation pt might benefit from referral to see neurology given his ongoing forward head posturing with axial weakness as well as cognitive deficits, advanced white matter atrophy, flat affect, hypophonia, and history of CVA. Extensive conversation with aptient and wife during visit today. Pt encouraged to continue HEP and follow-up as scheduled. No modifications made today to his home exercise program. He will benefit from short-term skilled PT services to increase cervical AROM/upright posture to progress to gym based ex. with personal trainer.   OBJECTIVE IMPAIRMENTS: decreased activity tolerance, decreased endurance, decreased mobility, decreased ROM, decreased strength, hypomobility, impaired flexibility, impaired UE functional use, improper body mechanics, postural dysfunction, and pain.   ACTIVITY LIMITATIONS: carrying, lifting, standing, and locomotion level  PARTICIPATION LIMITATIONS: driving, community activity, and yard work  PERSONAL FACTORS: Past/current experiences are also affecting patient's functional outcome.   REHAB POTENTIAL: Good  CLINICAL DECISION MAKING: Stable/uncomplicated  EVALUATION COMPLEXITY: Low   GOALS: Goals reviewed with patient? Yes  SHORT TERM GOALS:  Target date: 12/18/22  Pt. Independent with gym based/ HEP to increase B shoulder flexion/ abducton 1/2 muscle grade to improve UE mobility.  Baseline:  See above Goal status: ONGOING   LONG TERM GOALS: Target date: 03/30/2023   Pt. Will complete FOTO and improve to goal status in 8 weeks.  Baseline: 12/04/22: 99, predicted to decline to 95. Goal status: DISCONTINUED  2.  Pt. Will demonstrate proper chin tucks in supine/ seated posture with no increase in neck pain to improve posture.  Baseline: moderate verbal/tactile cuing; 02/02/23: minimal verbal cueing;  Goal status:  ONGOING  3.  Pt. Able to maintain proper upright posture/ head  position for 30 minutes while working on car in garage.   Baseline: <5 minutes (reported by wife); 02/02/23: Unchanged Goal status: ONGOING  4.  Pt. Will increase cervical lateral flexion to >30 deg. To improve pain-free mobility.   Baseline:  12 deg. L/R (significant stiffness), 02/02/23: L/R: 27/35; Goal status: PARTIALLY MET   PLAN:  PT FREQUENCY: 1x/week  PT DURATION: 8 weeks  PLANNED INTERVENTIONS: Therapeutic exercises, Therapeutic activity, Neuromuscular re-education, Gait training, Patient/Family education, Self Care, Joint mobilization, Dry Needling, Spinal mobilization, Cryotherapy, Moist heat, Traction, Manual therapy, and Re-evaluation  PLAN FOR NEXT SESSION: Pec stretches/ thoracic extension/ discuss appt.with personal trainer Ship broker).   Sharalyn Ink Laberta Wilbon PT, DPT, GCS  02/02/2023, 10:03 PM

## 2023-02-02 ENCOUNTER — Ambulatory Visit: Payer: PPO | Attending: Internal Medicine

## 2023-02-02 DIAGNOSIS — M6281 Muscle weakness (generalized): Secondary | ICD-10-CM | POA: Insufficient documentation

## 2023-02-02 DIAGNOSIS — R293 Abnormal posture: Secondary | ICD-10-CM | POA: Insufficient documentation

## 2023-02-02 DIAGNOSIS — M2560 Stiffness of unspecified joint, not elsewhere classified: Secondary | ICD-10-CM | POA: Diagnosis not present

## 2023-02-10 ENCOUNTER — Ambulatory Visit: Payer: PPO

## 2023-02-10 DIAGNOSIS — M6281 Muscle weakness (generalized): Secondary | ICD-10-CM

## 2023-02-10 DIAGNOSIS — M2560 Stiffness of unspecified joint, not elsewhere classified: Secondary | ICD-10-CM

## 2023-02-10 DIAGNOSIS — R293 Abnormal posture: Secondary | ICD-10-CM

## 2023-02-10 NOTE — Therapy (Signed)
OUTPATIENT PHYSICAL THERAPY CERVICALTREATMENT/RECERTIFICATION  Patient Name: Frank Gonzalez MRN: [id number] DOB:September 09, 1940, 82 y.o., male Today's Date: 02/10/2023  END OF SESSION:  PT End of Session - 02/10/23 1204     Visit Number 7    Number of Visits 17    Date for PT Re-Evaluation 03/30/23    Authorization Type eval: 11/10/22;    PT Start Time 1146    PT Stop Time 1230    PT Time Calculation (min) 44 min    Activity Tolerance Patient tolerated treatment well    Behavior During Therapy [name] for tasks assessed/performed            Past Medical History:  Diagnosis Date   Anal fissure    Cancer (HCC)    prostate   Diverticulitis large intestine    GERD (gastroesophageal reflux disease)    Inguinal hernia    Meniere disease    RLS (restless legs syndrome)    Schatzki's ring    Past Surgical History:  Procedure Laterality Date   ANAL FISSURE REPAIR  2004   COLONOSCOPY WITH PROPOFOL N/A 09/25/2015   Procedure: COLONOSCOPY WITH PROPOFOL;  Surgeon: Scot Jun, MD;  Location: [facility] ENDOSCOPY;  Service: Endoscopy;  Laterality: N/A;   HERNIA REPAIR     PROSTATECTOMY  2002   There are no problems to display for this patient.   PCP: Danella Penton, MD  REFERRING PROVIDER: Danella Penton, MD  REFERRING DIAG: Upper extremity muscle weakness, Kyphosis  THERAPY DIAG:  Muscle weakness (generalized)  Abnormal posture  Joint stiffness of spine  Rationale for Evaluation and Treatment: Rehabilitation  ONSET DATE: 08/03/2016  (chronic)  FROM INITIAL EVALUATION SUBJECTIVE:                                                                                                                                                                                                         SUBJECTIVE STATEMENT: Pt./wife report worsening of thoracic kyphotic posture/ cervical flexion with standing tasks. Pts. Wife states her husband walks around looking at floor.  Pt. Able to correct  head/upright position with cuing but returns to flexed posture.  Pt. Attends gym ([employer]) 2x/week with personal trainer, Beth, to work on generalized strengthening/ conditioning.  Pt. Using TM with light UE assist for safety.  No c/o neck pain at this time.    Hand dominance: Right  PERTINENT HISTORY:  Pt. Known to PT clinic.  Pts. wife, Bonita Quin, present to assist with PMHx.    PAIN:  Are you having pain? No  PRECAUTIONS: None  WEIGHT BEARING RESTRICTIONS: No  FALLS:  Has patient fallen in last 6 months? No  LIVING ENVIRONMENT: Lives with: lives with their spouse Lives in: House/apartment Has following equipment at home: None  OCCUPATION: Retired  PLOF: Independent  PATIENT GOALS: Increase neck/UE muscle strength and improve posture.  NEXT MD VISIT: PRN  OBJECTIVE:   DIAGNOSTIC FINDINGS:  No recent imaging in chart  PATIENT SURVEYS:  NDI :TBD FOTO :TBD  COGNITION: Overall cognitive status: Within functional limits for tasks assessed  SENSATION: WFL  POSTURE: rounded shoulders, forward head, and increased thoracic kyphosis  PALPATION: No tenderness with palpation along cervical/thoracic paraspinals.  Tightness noted in B pec musculature.   CERVICAL ROM:   Seated position  Active ROM A/PROM (deg) eval  Flexion 69 deg  Extension 15 deg  Right lateral flexion 12 deg.  Left lateral flexion 12 deg.  Right rotation 55 deg.  Left rotation 52 deg.   (Blank rows = not tested)  UPPER EXTREMITY ROM: B UE AROM WFL.  No shoulder/elbow limitations noted.    UPPER EXTREMITY MMT:  MMT Right eval Left eval  Shoulder flexion 4+/5 4+/5  Shoulder extension 5/5 5/5  Shoulder abduction 4/5 4/5  Shoulder adduction 5/5 5/5      Shoulder internal rotation 5/5 5/5  Shoulder external rotation 5/5 5/5  Middle trapezius 5/5 5/5  Lower trapezius    Elbow flexion 5/5 5/5  Elbow extension 5/5 5/5  Wrist flexion    Wrist extension    Wrist ulnar deviation     Wrist radial deviation    Wrist pronation    Wrist supination    Grip strength TBD TBD   (Blank rows = not tested)  CERVICAL SPECIAL TESTS:  NT  FUNCTIONAL TESTS:  NT   TODAY'S TREATMENT:                                                                                                                                SUBJECTIVE: Pt reports that he is doing well today. He saw his trainer Beth on Monday. He has been performing his HEP as well. No pain reported upon arrival. Pt states that he has noticed an improvement in his posture however his wife reports continued poor endurance maintaining his head/neck in an upright position.   PAIN: Denies   Ther-ex  UBE x 5 minutes for warm-up during interval history (2.5 min forward/2.5 min backwards);  Updated outcome measures with patient: Cervical ROM:  CERVICAL ROM:    Active ROM A/PROM (deg) eval  Flexion 76 deg  Extension 24 deg  Right lateral flexion 35 deg.  Left lateral flexion 27 deg.  Right rotation 73 deg.  Left rotation 66 deg.   (Blank rows = not tested)  Supine sternal and clavicular pec stretches with rolled towel along long axis of spine 2 x 1 minutes each, utilized cane with 5# ankle weight overhead during sternal head stretch; ; Supine horizontal abduction/reverse  fly with blue tband and rolled towel along long axis of spine 2 x 15; Supine repeated cervical retractions with rolled towel along long axis of spine and overpressure from therpaist x 10; Supine thoracic extension stretch with rolled towel placed perpendicular to spine at mid thoracic region and arms stretched overhead x 1 minute; Extensive conversation with patient and wife regarding plan of care and possibility of seeing a neurologist to further discuss possible causes for flat affect, hypophonia, and 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;   Not performed: Sidelying open book thoracic rotations (cervical rotation encouraged) along with brief overpressure  stretch x 10 toward each direction; Sidelying thoracic rotation strengthening with blue tband 2 x 10 on each side; Seated blue pball roll outs x 10; Seated blue tband rows x 10, verbal cues for scapular retraction; Seated scapular retractions x 10; Nautilus lat pull down 95# x 10, 110# x 10, 3s stretch/hold for stretch at top between each rep; Nautilus rows 60# 2 x 10, verbal and tactile cues for scapular retraction and posture;   PATIENT EDUCATION:  Education details: Pt educated throughout session about proper posture and technique with exercises. Improved exercise technique, movement at target joints, use of target muscles after min to mod verbal, visual, tactile cues.  Person educated: Patient and Spouse Education method: Explanation, Demonstration, and Handouts Education comprehension: verbalized understanding and returned demonstration   HOME EXERCISE PROGRAM: Access Code: [email or web address] URL: [email or web address] Date: 12/04/2022 Prepared by: Ria Comment  Exercises - Supine Cervical Retraction with Towel  - 2 x daily - 7 x weekly - 1 sets - 5 reps - Supine Shoulder Flexion with Dowel AAROM - Palms Up  - 2 x daily - 7 x weekly - 1 sets - 5 reps - Supine Chest Stretch with Elbows Bent  - 2 x daily - 7 x weekly - 1 sets - 5 reps - Supine Thoracic Mobilization Towel Roll Horizontal with Arm Stretch  - 2 x daily - 7 x weekly - 3 reps - 60s hold   ASSESSMENT:  CLINICAL IMPRESSION: Patient demonstrates excellent motivation during session. Updated outcome measures during visit today. Significant improvement noted in cervical range of motion today compared to initial evaluation. Pt reports some improvement in posture however wife reports continued poor cervical endurance and persistent forward/dropped head positioning. Discussed possibility of dropped head syndrome. Pending PCP recommendation pt might benefit from referral to see neurology given his ongoing forward head  posturing with axial weakness as well as cognitive deficits, advanced white matter atrophy, flat affect, hypophonia, and history of CVA. Extensive conversation with aptient and wife during visit today. Pt encouraged to continue HEP and follow-up as scheduled. No modifications made today to his home exercise program. He will benefit from short-term skilled PT services to increase cervical AROM/upright posture to progress to gym based ex. with personal trainer.   OBJECTIVE IMPAIRMENTS: decreased activity tolerance, decreased endurance, decreased mobility, decreased ROM, decreased strength, hypomobility, impaired flexibility, impaired UE functional use, improper body mechanics, postural dysfunction, and pain.   ACTIVITY LIMITATIONS: carrying, lifting, standing, and locomotion level  PARTICIPATION LIMITATIONS: driving, community activity, and yard work  PERSONAL FACTORS: Past/current experiences are also affecting patient's functional outcome.   REHAB POTENTIAL: Good  CLINICAL DECISION MAKING: Stable/uncomplicated  EVALUATION COMPLEXITY: Low   GOALS: Goals reviewed with patient? Yes  SHORT TERM GOALS: Target date: 12/18/22  Pt. Independent with gym based/ HEP to increase B shoulder flexion/ abducton 1/2 muscle grade to improve UE mobility.  Baseline:  See above Goal  status: ONGOING   LONG TERM GOALS: Target date: 03/30/2023   Pt. Will complete FOTO and improve to goal status in 8 weeks.  Baseline: 12/04/22: 99, predicted to decline to 95. Goal status: DISCONTINUED  2.  Pt. Will demonstrate proper chin tucks in supine/ seated posture with no increase in neck pain to improve posture.  Baseline: moderate verbal/tactile cuing; 02/02/23: minimal verbal cueing;  Goal status:  ONGOING  3.  Pt. Able to maintain proper upright posture/ head position for 30 minutes while working on car in garage.   Baseline: <5 minutes (reported by wife); 02/02/23: Unchanged Goal status: ONGOING  4.  Pt. Will  increase cervical lateral flexion to >30 deg. To improve pain-free mobility.   Baseline:  12 deg. L/R (significant stiffness), 02/02/23: L/R: 27/35; Goal status: PARTIALLY MET   PLAN:  PT FREQUENCY: 1x/week  PT DURATION: 8 weeks  PLANNED INTERVENTIONS: Therapeutic exercises, Therapeutic activity, Neuromuscular re-education, Gait training, Patient/Family education, Self Care, Joint mobilization, Dry Needling, Spinal mobilization, Cryotherapy, Moist heat, Traction, Manual therapy, and Re-evaluation  PLAN FOR NEXT SESSION: Pec stretches/ thoracic extension/ discuss appt.with personal trainer Ship broker).   Sharalyn Ink Monia Timmers PT, DPT, GCS  02/10/2023, 12:04 PM

## 2023-02-18 ENCOUNTER — Ambulatory Visit: Payer: PPO

## 2023-02-18 DIAGNOSIS — M6281 Muscle weakness (generalized): Secondary | ICD-10-CM | POA: Diagnosis not present

## 2023-02-18 NOTE — Therapy (Signed)
OUTPATIENT PHYSICAL THERAPY CERVICALTREATMENT  Patient Name: Frank Gonzalez MRN: [id number] DOB:[birth year], 82 y.o., male Today's Date: 02/18/2023  END OF SESSION:  PT End of Session - 02/18/23 1413     Visit Number 8    Number of Visits 17    Date for PT Re-Evaluation 03/30/23    Authorization Type eval: 11/10/22;    PT Start Time 1405    PT Stop Time 1445    PT Time Calculation (min) 40 min    Activity Tolerance Patient tolerated treatment well    Behavior During Therapy [facility] for tasks assessed/performed            Past Medical History:  Diagnosis Date   Anal fissure    Cancer (HCC)    prostate   Diverticulitis large intestine    GERD (gastroesophageal reflux disease)    Inguinal hernia    Meniere disease    RLS (restless legs syndrome)    Schatzki's ring    Past Surgical History:  Procedure Laterality Date   ANAL FISSURE REPAIR  2004   COLONOSCOPY WITH PROPOFOL N/A 09/25/2015   Procedure: COLONOSCOPY WITH PROPOFOL;  Surgeon: Scot Jun, MD;  Location: [facility] ENDOSCOPY;  Service: Endoscopy;  Laterality: N/A;   HERNIA REPAIR     PROSTATECTOMY  2002   There are no problems to display for this patient.   PCP: Danella Penton, MD  REFERRING PROVIDER: Danella Penton, MD  REFERRING DIAG: Upper extremity muscle weakness, Kyphosis  THERAPY DIAG:  Muscle weakness (generalized)  Rationale for Evaluation and Treatment: Rehabilitation  ONSET DATE: 08/03/2016  (chronic)  FROM INITIAL EVALUATION SUBJECTIVE:                                                                                                                                                                                                         SUBJECTIVE STATEMENT: Pt./wife report worsening of thoracic kyphotic posture/ cervical flexion with standing tasks. Pts. Wife states her husband walks around looking at floor.  Pt. Able to correct head/upright position with cuing but returns to flexed posture.  Pt.  Attends gym ([employer]) 2x/week with personal trainer, Beth, to work on generalized strengthening/ conditioning.  Pt. Using TM with light UE assist for safety.  No c/o neck pain at this time.    Hand dominance: Right  PERTINENT HISTORY:  Pt. Known to PT clinic.  Pts. wife, Bonita Quin, present to assist with PMHx.    PAIN:  Are you having pain? No  PRECAUTIONS: None  WEIGHT BEARING RESTRICTIONS: No  FALLS:  Has  patient fallen in last 6 months? No  LIVING ENVIRONMENT: Lives with: lives with their spouse Lives in: House/apartment Has following equipment at home: None  OCCUPATION: Retired  PLOF: Independent  PATIENT GOALS: Increase neck/UE muscle strength and improve posture.  NEXT MD VISIT: PRN  OBJECTIVE:   DIAGNOSTIC FINDINGS:  No recent imaging in chart  PATIENT SURVEYS:  NDI :TBD FOTO :TBD  COGNITION: Overall cognitive status: Within functional limits for tasks assessed  SENSATION: WFL  POSTURE: rounded shoulders, forward head, and increased thoracic kyphosis  PALPATION: No tenderness with palpation along cervical/thoracic paraspinals.  Tightness noted in B pec musculature.   CERVICAL ROM:   Seated position  Active ROM A/PROM (deg) eval  Flexion 69 deg  Extension 15 deg  Right lateral flexion 12 deg.  Left lateral flexion 12 deg.  Right rotation 55 deg.  Left rotation 52 deg.   (Blank rows = not tested)  CERVICAL ROM (02/02/23):    Active ROM A/PROM (deg) eval  Flexion 76 deg  Extension 24 deg  Right lateral flexion 35 deg.  Left lateral flexion 27 deg.  Right rotation 73 deg.  Left rotation 66 deg.   (Blank rows = not tested)  UPPER EXTREMITY ROM: B UE AROM WFL.  No shoulder/elbow limitations noted.    UPPER EXTREMITY MMT:  MMT Right eval Left eval  Shoulder flexion 4+/5 4+/5  Shoulder extension 5/5 5/5  Shoulder abduction 4/5 4/5  Shoulder adduction 5/5 5/5      Shoulder internal rotation 5/5 5/5  Shoulder external rotation 5/5  5/5  Middle trapezius 5/5 5/5  Lower trapezius    Elbow flexion 5/5 5/5  Elbow extension 5/5 5/5  Wrist flexion    Wrist extension    Wrist ulnar deviation    Wrist radial deviation    Wrist pronation    Wrist supination    Grip strength TBD TBD   (Blank rows = not tested)  CERVICAL SPECIAL TESTS:  NT  FUNCTIONAL TESTS:  NT   TREATMENT:                                                                                                                                SUBJECTIVE: Pt reports that he is doing well today. He saw his trainer Beth this morning and worked on both lower and upper body strengthening. He has been performing his HEP without issue. No pain reported upon arrival. Pt states that his trainer noticed an improvement in his posture today.   PAIN: Denies   Ther-ex  UBE x 5 minutes (2.5 forward/2.5 backward) for warm-up, strengthening, and interval history (2 minutes unbilled); Supine sternal and clavicular pec stretches with rolled towel along long axis of spine 2 x 1 minutes each, utilized cane with 5# ankle weight overhead during sternal head stretch; ; Supine horizontal abduction/reverse fly with blue tband and rolled towel along long axis of spine 2 x 15; Supine repeated cervical retractions  with rolled towel along long axis of spine 2 x 15; Supine repeated thoracic extensions over 1/2 foam roll at mid and upper thoracic regions with fingers interlaced behind head, elbows together, and therapist providing overpressure x multiple bouts at each level; Sidelying open book thoracic rotations (cervical rotation encouraged) along with brief overpressure stretch 2 x 10 toward each direction; Sidelying thoracic rotation strengthening with blue tband 2 x 10 on each side; Seated blue pball roll outs x 10;  Not performed: Seated blue tband rows x 10, verbal cues for scapular retraction; Seated scapular retractions x 10; Nautilus lat pull down 95# x 10, 110# x 10, 3s  stretch/hold for stretch at top between each rep; Nautilus rows 60# 2 x 10, verbal and tactile cues for scapular retraction and posture;   PATIENT EDUCATION:  Education details: Pt educated throughout session about proper posture and technique with exercises. Improved exercise technique, movement at target joints, use of target muscles after min to mod verbal, visual, tactile cues.  Person educated: Patient and Spouse Education method: Explanation, Demonstration, and Handouts Education comprehension: verbalized understanding and returned demonstration   HOME EXERCISE PROGRAM: Access Code: [email or web address] URL: [email or web address] Date: 12/04/2022 Prepared by: Ria Comment  Exercises - Supine Cervical Retraction with Towel  - 2 x daily - 7 x weekly - 1 sets - 5 reps - Supine Shoulder Flexion with Dowel AAROM - Palms Up  - 2 x daily - 7 x weekly - 1 sets - 5 reps - Supine Chest Stretch with Elbows Bent  - 2 x daily - 7 x weekly - 1 sets - 5 reps - Supine Thoracic Mobilization Towel Roll Horizontal with Arm Stretch  - 2 x daily - 7 x weekly - 3 reps - 60s hold   ASSESSMENT:  CLINICAL IMPRESSION: Patient demonstrates excellent motivation during session. Progressed thoracic and periscapular strengthening in order to improve posture and muscle endurance. Plan to progress at future sessions. Pt encouraged to continue HEP and follow-up as scheduled. No modifications made today to his home exercise program. He will benefit from short-term skilled PT services to increase cervical AROM/upright posture to progress to gym based ex. with personal trainer.   OBJECTIVE IMPAIRMENTS: decreased activity tolerance, decreased endurance, decreased mobility, decreased ROM, decreased strength, hypomobility, impaired flexibility, impaired UE functional use, improper body mechanics, postural dysfunction, and pain.   ACTIVITY LIMITATIONS: carrying, lifting, standing, and locomotion  level  PARTICIPATION LIMITATIONS: driving, community activity, and yard work  PERSONAL FACTORS: Past/current experiences are also affecting patient's functional outcome.   REHAB POTENTIAL: Good  CLINICAL DECISION MAKING: Stable/uncomplicated  EVALUATION COMPLEXITY: Low   GOALS: Goals reviewed with patient? Yes  SHORT TERM GOALS: Target date: 12/18/22  Pt. Independent with gym based/ HEP to increase B shoulder flexion/ abducton 1/2 muscle grade to improve UE mobility.  Baseline:  See above Goal status: ONGOING   LONG TERM GOALS: Target date: 03/30/2023   Pt. Will complete FOTO and improve to goal status in 8 weeks.  Baseline: 12/04/22: 99, predicted to decline to 95. Goal status: DISCONTINUED  2.  Pt. Will demonstrate proper chin tucks in supine/ seated posture with no increase in neck pain to improve posture.  Baseline: moderate verbal/tactile cuing; 02/02/23: minimal verbal cueing;  Goal status:  ONGOING  3.  Pt. Able to maintain proper upright posture/ head position for 30 minutes while working on car in garage.   Baseline: <5 minutes (reported by wife); 02/02/23: Unchanged Goal status: ONGOING  4.  Pt. Will increase  cervical lateral flexion to >30 deg. To improve pain-free mobility.   Baseline:  12 deg. L/R (significant stiffness), 02/02/23: L/R: 27/35; Goal status: PARTIALLY MET   PLAN:  PT FREQUENCY: 1x/week  PT DURATION: 8 weeks  PLANNED INTERVENTIONS: Therapeutic exercises, Therapeutic activity, Neuromuscular re-education, Gait training, Patient/Family education, Self Care, Joint mobilization, Dry Needling, Spinal mobilization, Cryotherapy, Moist heat, Traction, Manual therapy, and Re-evaluation  PLAN FOR NEXT SESSION: Pec stretches/ thoracic extension/ discuss appt.with personal trainer [employer]).   Sharalyn Ink Valissa Lyvers PT, DPT, GCS  02/18/2023, 4:14 PM

## 2023-02-24 ENCOUNTER — Ambulatory Visit: Payer: PPO

## 2023-02-24 DIAGNOSIS — R293 Abnormal posture: Secondary | ICD-10-CM

## 2023-02-24 DIAGNOSIS — M6281 Muscle weakness (generalized): Secondary | ICD-10-CM

## 2023-03-03 ENCOUNTER — Ambulatory Visit: Payer: PPO | Admitting: Physical Therapy

## 2023-03-03 ENCOUNTER — Encounter: Payer: Self-pay | Admitting: Physical Therapy

## 2023-03-03 DIAGNOSIS — R293 Abnormal posture: Secondary | ICD-10-CM

## 2023-03-03 DIAGNOSIS — M6281 Muscle weakness (generalized): Secondary | ICD-10-CM

## 2023-03-03 DIAGNOSIS — M2560 Stiffness of unspecified joint, not elsewhere classified: Secondary | ICD-10-CM

## 2023-03-03 NOTE — Therapy (Signed)
OUTPATIENT PHYSICAL THERAPY CERVICAL TREATMENT/ DISCHARGE  Patient Name: Frank Gonzalez MRN: [id number] DOB:1941/04/19, 82 y.o., male Today's Date: 03/03/2023  END OF SESSION:  PT End of Session - 03/03/23 0854     Visit Number 9    Number of Visits 17    Date for PT Re-Evaluation 03/30/23    Authorization Type eval: 11/10/22;    PT Start Time 0854    PT Stop Time 0947    PT Time Calculation (min) 53 min    Activity Tolerance Patient tolerated treatment well    Behavior During Therapy [facility] for tasks assessed/performed             Past Medical History:  Diagnosis Date   Anal fissure    Cancer (HCC)    prostate   Diverticulitis large intestine    GERD (gastroesophageal reflux disease)    Inguinal hernia    Meniere disease    RLS (restless legs syndrome)    Schatzki's ring    Past Surgical History:  Procedure Laterality Date   ANAL FISSURE REPAIR  2004   COLONOSCOPY WITH PROPOFOL N/A 09/25/2015   Procedure: COLONOSCOPY WITH PROPOFOL;  Surgeon: Scot Jun, MD;  Location: [facility] ENDOSCOPY;  Service: Endoscopy;  Laterality: N/A;   HERNIA REPAIR     PROSTATECTOMY  2002   There are no problems to display for this patient.   PCP: Danella Penton, MD  REFERRING PROVIDER: Danella Penton, MD  REFERRING DIAG: Upper extremity muscle weakness, Kyphosis  THERAPY DIAG:  Muscle weakness (generalized)  Abnormal posture  Joint stiffness of spine  Rationale for Evaluation and Treatment: Rehabilitation  ONSET DATE: 08/03/2016  (chronic)  FROM INITIAL EVALUATION SUBJECTIVE:                                                                                                                                                                                                         SUBJECTIVE STATEMENT: Pt./wife report worsening of thoracic kyphotic posture/ cervical flexion with standing tasks. Pts. Wife states her husband walks around looking at floor.  Pt. Able to correct  head/upright position with cuing but returns to flexed posture.  Pt. Attends gym ([employer]) 2x/week with personal trainer, Beth, to work on generalized strengthening/ conditioning.  Pt. Using TM with light UE assist for safety.  No c/o neck pain at this time.    Hand dominance: Right  PERTINENT HISTORY:  Pt. Known to PT clinic.  Pts. wife, Bonita Quin, present to assist with PMHx.    PAIN:  Are you having pain? No  PRECAUTIONS: None  WEIGHT BEARING RESTRICTIONS: No  FALLS:  Has patient fallen in last 6 months? No  LIVING ENVIRONMENT: Lives with: lives with their spouse Lives in: House/apartment Has following equipment at home: None  OCCUPATION: Retired  PLOF: Independent  PATIENT GOALS: Increase neck/UE muscle strength and improve posture.  NEXT MD VISIT: PRN  OBJECTIVE:   DIAGNOSTIC FINDINGS:  No recent imaging in chart  PATIENT SURVEYS:  NDI :TBD FOTO :TBD  COGNITION: Overall cognitive status: Within functional limits for tasks assessed  SENSATION: WFL  POSTURE: rounded shoulders, forward head, and increased thoracic kyphosis  PALPATION: No tenderness with palpation along cervical/thoracic paraspinals.  Tightness noted in B pec musculature.   CERVICAL ROM:   Seated position  Active ROM A/PROM (deg) eval  Flexion 69 deg  Extension 15 deg  Right lateral flexion 12 deg.  Left lateral flexion 12 deg.  Right rotation 55 deg.  Left rotation 52 deg.   (Blank rows = not tested)  CERVICAL ROM (02/02/23):    Active ROM A/PROM (deg) eval  Flexion 76 deg  Extension 24 deg  Right lateral flexion 35 deg.  Left lateral flexion 27 deg.  Right rotation 73 deg.  Left rotation 66 deg.   (Blank rows = not tested)  UPPER EXTREMITY ROM: B UE AROM WFL.  No shoulder/elbow limitations noted.    UPPER EXTREMITY MMT:  MMT Right eval Left eval  Shoulder flexion 4+/5 4+/5  Shoulder extension 5/5 5/5  Shoulder abduction 4/5 4/5  Shoulder adduction 5/5 5/5       Shoulder internal rotation 5/5 5/5  Shoulder external rotation 5/5 5/5  Middle trapezius 5/5 5/5  Lower trapezius    Elbow flexion 5/5 5/5  Elbow extension 5/5 5/5  Wrist flexion    Wrist extension    Wrist ulnar deviation    Wrist radial deviation    Wrist pronation    Wrist supination    Grip strength TBD TBD   (Blank rows = not tested)  CERVICAL SPECIAL TESTS:  NT  FUNCTIONAL TESTS:  NT   TREATMENT:  03/03/2023                                                                                                                     SUBJECTIVE: Pt reports that he is doing well today.  Pts. Wife discussed pt. Is starting to take meds for memory/ cognitive issues.  He has been performing his HEP without issue and continues to see Beth 2x/week.   PAIN: Denies  Ther-ex  UBE x 5 minutes (2.5 forward/2.5 backward) for warm-up, strengthening, and interval history (discussed posture/ HEP).    Supine sternal and clavicular pec stretches with OP/ tactile feedback 2 x 1 minutes each, 3# B shoulder flexion/ supine horizontal abduction/reverse fly 2 x 15;  Supine repeated cervical retractions with manual feedback 2 x 15; Supine shoulder flexion stretches 5x.    Standing at wall for posture feedback (added shoulder flexion/ horizontal abduction).  Standing 3# shoulder flexion/  bicep curls/ horizontal abduction/ adduction 10x2 each.  Supine/ seated UT/ rotn. Stretches 2x each with static holds.      Reviewed HEP/ discharge    PATIENT EDUCATION:  Education details: Pt educated throughout session about proper posture and technique with exercises. Improved exercise technique, movement at target joints, use of target muscles after min to mod verbal, visual, tactile cues.  Person educated: Patient and Spouse Education method: Explanation, Demonstration, and Handouts Education comprehension: verbalized understanding and returned demonstration   HOME EXERCISE PROGRAM: Access Code:  [email or web address] URL: [email or web address] Date: 12/04/2022 Prepared by: Ria Comment  Exercises - Supine Cervical Retraction with Towel  - 2 x daily - 7 x weekly - 1 sets - 5 reps - Supine Shoulder Flexion with Dowel AAROM - Palms Up  - 2 x daily - 7 x weekly - 1 sets - 5 reps - Supine Chest Stretch with Elbows Bent  - 2 x daily - 7 x weekly - 1 sets - 5 reps - Supine Thoracic Mobilization Towel Roll Horizontal with Arm Stretch  - 2 x daily - 7 x weekly - 3 reps - 60s hold   ASSESSMENT:  CLINICAL IMPRESSION: Patient demonstrates excellent motivation during session.  Pt encouraged to continue HEP and exercise program with Beth 2x/week. No modifications made today to his home exercise program.  See updated goals.  Discharge from PT at this time.    OBJECTIVE IMPAIRMENTS: decreased activity tolerance, decreased endurance, decreased mobility, decreased ROM, decreased strength, hypomobility, impaired flexibility, impaired UE functional use, improper body mechanics, postural dysfunction, and pain.   ACTIVITY LIMITATIONS: carrying, lifting, standing, and locomotion level  PARTICIPATION LIMITATIONS: driving, community activity, and yard work  PERSONAL FACTORS: Past/current experiences are also affecting patient's functional outcome.   REHAB POTENTIAL: Good  CLINICAL DECISION MAKING: Stable/uncomplicated  EVALUATION COMPLEXITY: Low   GOALS: Goals reviewed with patient? Yes  SHORT TERM GOALS: Target date: 12/18/22  Pt. Independent with gym based/ HEP to increase B shoulder flexion/ abducton 1/2 muscle grade to improve UE mobility.  Baseline:  See above Goal status: Goal met   LONG TERM GOALS: Target date: 03/30/2023   Pt. Will complete FOTO and improve to goal status in 8 weeks.  Baseline: 12/04/22: 99, predicted to decline to 95. Goal status: DISCONTINUED  2.  Pt. Will demonstrate proper chin tucks in supine/ seated posture with no increase in neck pain to improve  posture.  Baseline: moderate verbal/tactile cuing; 02/02/23: minimal verbal cueing;  Goal status:  Goal met   3.  Pt. Able to maintain proper upright posture/ head position for 30 minutes while working on car in garage.   Baseline: <5 minutes (reported by wife); 02/02/23: Unchanged Goal status: Partially met  4.  Pt. Will increase cervical lateral flexion to >30 deg. To improve pain-free mobility.   Baseline:  12 deg. L/R (significant stiffness), 02/02/23: L/R: 27/35; Goal status: Partially met   PLAN:  PT FREQUENCY: 1x/week  PT DURATION: 8 weeks  PLANNED INTERVENTIONS: Therapeutic exercises, Therapeutic activity, Neuromuscular re-education, Gait training, Patient/Family education, Self Care, Joint mobilization, Dry Needling, Spinal mobilization, Cryotherapy, Moist heat, Traction, Manual therapy, and Re-evaluation  PLAN FOR NEXT SESSION: Discharge  Cammie Mcgee, PT, DPT # [phone] 03/03/2023, 12:30 PM

## 2023-04-28 DIAGNOSIS — H90A21 Sensorineural hearing loss, unilateral, right ear, with restricted hearing on the contralateral side: Secondary | ICD-10-CM | POA: Diagnosis not present

## 2023-04-28 DIAGNOSIS — J301 Allergic rhinitis due to pollen: Secondary | ICD-10-CM | POA: Diagnosis not present

## 2023-04-28 DIAGNOSIS — H6123 Impacted cerumen, bilateral: Secondary | ICD-10-CM | POA: Diagnosis not present

## 2023-04-28 DIAGNOSIS — H8103 Meniere's disease, bilateral: Secondary | ICD-10-CM | POA: Diagnosis not present

## 2023-05-20 DIAGNOSIS — Z23 Encounter for immunization: Secondary | ICD-10-CM | POA: Diagnosis not present

## 2023-05-25 DIAGNOSIS — L570 Actinic keratosis: Secondary | ICD-10-CM | POA: Diagnosis not present

## 2023-05-25 DIAGNOSIS — D225 Melanocytic nevi of trunk: Secondary | ICD-10-CM | POA: Diagnosis not present

## 2023-05-25 DIAGNOSIS — D2272 Melanocytic nevi of left lower limb, including hip: Secondary | ICD-10-CM | POA: Diagnosis not present

## 2023-05-25 DIAGNOSIS — D2262 Melanocytic nevi of left upper limb, including shoulder: Secondary | ICD-10-CM | POA: Diagnosis not present

## 2023-05-25 DIAGNOSIS — D2261 Melanocytic nevi of right upper limb, including shoulder: Secondary | ICD-10-CM | POA: Diagnosis not present

## 2023-05-25 DIAGNOSIS — D2271 Melanocytic nevi of right lower limb, including hip: Secondary | ICD-10-CM | POA: Diagnosis not present

## 2023-05-25 DIAGNOSIS — L821 Other seborrheic keratosis: Secondary | ICD-10-CM | POA: Diagnosis not present

## 2023-05-25 DIAGNOSIS — X32XXXA Exposure to sunlight, initial encounter: Secondary | ICD-10-CM | POA: Diagnosis not present

## 2023-07-06 DIAGNOSIS — E782 Mixed hyperlipidemia: Secondary | ICD-10-CM | POA: Diagnosis not present

## 2023-07-06 DIAGNOSIS — Z125 Encounter for screening for malignant neoplasm of prostate: Secondary | ICD-10-CM | POA: Diagnosis not present

## 2023-07-09 DIAGNOSIS — H532 Diplopia: Secondary | ICD-10-CM | POA: Diagnosis not present

## 2023-07-09 DIAGNOSIS — Z961 Presence of intraocular lens: Secondary | ICD-10-CM | POA: Diagnosis not present

## 2023-07-13 DIAGNOSIS — E782 Mixed hyperlipidemia: Secondary | ICD-10-CM | POA: Diagnosis not present

## 2023-07-13 DIAGNOSIS — Z0000 Encounter for general adult medical examination without abnormal findings: Secondary | ICD-10-CM | POA: Diagnosis not present

## 2023-07-13 DIAGNOSIS — R739 Hyperglycemia, unspecified: Secondary | ICD-10-CM | POA: Diagnosis not present

## 2023-07-13 DIAGNOSIS — I63412 Cerebral infarction due to embolism of left middle cerebral artery: Secondary | ICD-10-CM | POA: Diagnosis not present

## 2023-07-13 DIAGNOSIS — F01A0 Vascular dementia, mild, without behavioral disturbance, psychotic disturbance, mood disturbance, and anxiety: Secondary | ICD-10-CM | POA: Diagnosis not present

## 2023-08-09 DIAGNOSIS — F01A0 Vascular dementia, mild, without behavioral disturbance, psychotic disturbance, mood disturbance, and anxiety: Secondary | ICD-10-CM | POA: Diagnosis not present

## 2023-08-09 DIAGNOSIS — I63412 Cerebral infarction due to embolism of left middle cerebral artery: Secondary | ICD-10-CM | POA: Diagnosis not present

## 2023-08-09 DIAGNOSIS — G3184 Mild cognitive impairment, so stated: Secondary | ICD-10-CM | POA: Diagnosis not present

## 2023-08-16 ENCOUNTER — Other Ambulatory Visit: Payer: Self-pay | Admitting: Neurology

## 2023-08-16 DIAGNOSIS — G3184 Mild cognitive impairment, so stated: Secondary | ICD-10-CM

## 2023-08-16 DIAGNOSIS — I63412 Cerebral infarction due to embolism of left middle cerebral artery: Secondary | ICD-10-CM

## 2023-08-27 ENCOUNTER — Other Ambulatory Visit: Payer: PPO

## 2023-08-31 ENCOUNTER — Ambulatory Visit
Admission: RE | Admit: 2023-08-31 | Discharge: 2023-08-31 | Disposition: A | Discharge status: Home / Self Care | Payer: PPO | Source: Ambulatory Visit | Attending: Neurology | Admitting: Neurology

## 2023-08-31 DIAGNOSIS — G3184 Mild cognitive impairment, so stated: Secondary | ICD-10-CM

## 2023-08-31 DIAGNOSIS — I63412 Cerebral infarction due to embolism of left middle cerebral artery: Secondary | ICD-10-CM

## 2023-09-08 DIAGNOSIS — R41841 Cognitive communication deficit: Secondary | ICD-10-CM | POA: Diagnosis not present

## 2023-09-29 DIAGNOSIS — R41841 Cognitive communication deficit: Secondary | ICD-10-CM | POA: Diagnosis not present

## 2023-10-13 ENCOUNTER — Other Ambulatory Visit: Payer: Self-pay | Admitting: Student

## 2023-10-13 DIAGNOSIS — I63412 Cerebral infarction due to embolism of left middle cerebral artery: Secondary | ICD-10-CM

## 2023-10-13 DIAGNOSIS — E559 Vitamin D deficiency, unspecified: Secondary | ICD-10-CM | POA: Diagnosis not present

## 2023-10-13 DIAGNOSIS — G3184 Mild cognitive impairment, so stated: Secondary | ICD-10-CM | POA: Diagnosis not present

## 2023-10-13 DIAGNOSIS — G2581 Restless legs syndrome: Secondary | ICD-10-CM | POA: Diagnosis not present

## 2023-10-15 DIAGNOSIS — R41841 Cognitive communication deficit: Secondary | ICD-10-CM | POA: Diagnosis not present

## 2023-10-18 ENCOUNTER — Ambulatory Visit
Admission: RE | Admit: 2023-10-18 | Discharge: 2023-10-18 | Disposition: A | Discharge status: Home / Self Care | Source: Ambulatory Visit | Attending: Student | Admitting: Student

## 2023-10-18 DIAGNOSIS — I63412 Cerebral infarction due to embolism of left middle cerebral artery: Secondary | ICD-10-CM | POA: Insufficient documentation

## 2023-10-18 DIAGNOSIS — I6523 Occlusion and stenosis of bilateral carotid arteries: Secondary | ICD-10-CM | POA: Diagnosis not present

## 2023-10-20 DIAGNOSIS — R41841 Cognitive communication deficit: Secondary | ICD-10-CM | POA: Diagnosis not present

## 2023-10-27 ENCOUNTER — Inpatient Hospital Stay

## 2023-10-27 ENCOUNTER — Encounter: Admitting: Oncology

## 2023-10-27 ENCOUNTER — Ambulatory Visit

## 2023-10-27 ENCOUNTER — Encounter: Payer: Self-pay | Admitting: Oncology

## 2023-10-27 ENCOUNTER — Inpatient Hospital Stay: Attending: Oncology | Admitting: Oncology

## 2023-10-27 VITALS — BP 135/81 | HR 56 | Temp 95.6°F | Resp 18 | Ht 72.0 in | Wt 188.1 lb

## 2023-10-27 VITALS — BP 128/69 | HR 69 | Temp 96.4°F | Resp 18

## 2023-10-27 DIAGNOSIS — R5383 Other fatigue: Secondary | ICD-10-CM | POA: Insufficient documentation

## 2023-10-27 DIAGNOSIS — R790 Abnormal level of blood mineral: Secondary | ICD-10-CM

## 2023-10-27 MED ORDER — SODIUM CHLORIDE 0.9 % IV SOLN
INTRAVENOUS | Status: DC
  Filled 2023-10-27: qty 250

## 2023-10-27 MED ORDER — SODIUM CHLORIDE 0.9 % IV SOLN
1000.0000 mg | Freq: Once | INTRAVENOUS | Status: AC
  Administered 2023-10-27: 1000 mg via INTRAVENOUS
  Filled 2023-10-27: qty 1000

## 2023-10-27 NOTE — Progress Notes (Unsigned)
 Hematology/Oncology Consult note [facility] Telephone:([phone] Fax:([phone]  Patient Care Team: Danella Penton, MD as PCP - General (Internal Medicine)   Name of the patient: Frank Gonzalez  [id number]  1941/02/09    Reason for referral-low ferritin   Referring physician-Dr. Bethann Punches  Date of visit: 10/27/23   History of presenting illness-patient is a 83 year old male with a past medical history significant for anxiety and GERD among other medical problems.  He is doing well for his age and is quite active.  He works part-time and is independent of his ADLs and IADLs.  His wife felt that patient was acting more fatigued as well as possibly pale and therefore had his blood work checked.He was noted to have a hemoglobin of 13.7/29.8 in January 2025.  Ferritin levels were low at 10 in March 2025 but iron studies were normal.  Of note patient has had low ferritin for the last 2 years.  He denies any blood loss in the stool or urine.  Denies any dark melanotic stools or consistent use of NSAIDs.  He has had colonoscopy over 5 years ago and does not wish to get another GI workup at this time.  ECOG PS- 1  Pain scale- 0   Review of systems- Review of Systems  Constitutional:  Positive for malaise/fatigue. Negative for chills, fever and weight loss.  HENT:  Negative for congestion, ear discharge and nosebleeds.   Eyes:  Negative for blurred vision.  Respiratory:  Negative for cough, hemoptysis, sputum production, shortness of breath and wheezing.   Cardiovascular:  Negative for chest pain, palpitations, orthopnea and claudication.  Gastrointestinal:  Negative for abdominal pain, blood in stool, constipation, diarrhea, heartburn, melena, nausea and vomiting.  Genitourinary:  Negative for dysuria, flank pain, frequency, hematuria and urgency.  Musculoskeletal:  Negative for back pain, joint pain and myalgias.  Skin:  Negative for rash.  Neurological:   Negative for dizziness, tingling, focal weakness, seizures, weakness and headaches.  Endo/Heme/Allergies:  Does not bruise/bleed easily.  Psychiatric/Behavioral:  Negative for depression and suicidal ideas. The patient does not have insomnia.     Allergies  Allergen Reactions   Ropinirole Other (See Comments)    Constipation    There are no active problems to display for this patient.    Past Medical History:  Diagnosis Date   Allergy    Anal fissure    Anxiety    Diverticulitis large intestine    GERD (gastroesophageal reflux disease)    Inguinal hernia    Meniere disease    Prostate cancer (HCC)    RLS (restless legs syndrome)    Schatzki's ring      Past Surgical History:  Procedure Laterality Date   ANAL FISSURE REPAIR  2004   COLONOSCOPY WITH PROPOFOL N/A 09/25/2015   Procedure: COLONOSCOPY WITH PROPOFOL;  Surgeon: Scot Jun, MD;  Location[facility] END[facility]COPY;  Service: Endoscopy;  Laterality: N/A;   HERNIA REPAIR     PROSTATECTOMY  2002    Social History   Socioeconomic History   Marital status: Married    Spouse name: Not on file   Number of children: Not on file   Years of education: Not on file   Highest education level: Not on file  Occupational History   Not on file  Tobacco Use   Smoking status: Never   Smokeless tobacco: Never  Vaping Use   Vaping status: Never Used  Substance and Sexual Activity   Alcohol use:  No   Drug use: No   Sexual activity: Not Currently  Other Topics Concern   Not on file  Social History Narrative   Not on file   Social Drivers of Health   Financial Resource Strain: Low Risk  (07/13/2023)   Received from [facility]   Overall Financial Resource Strain (CARDIA)    Difficulty of Paying Living Expenses: Not hard at all  Food Insecurity: No Food Insecurity (10/27/2023)   Hunger Vital Sign    Worried About Running Out of Food in the Last Year: Never true    Ran Out of Food in the Last Year:  Never true  Transportation Needs: No Transportation Needs (10/27/2023)   PRAPARE - Administrator, Civil Service (Medical): No    Lack of Transportation (Non-Medical): No  Physical Activity: Not on file  Stress: Not on file  Social Connections: Not on file  Intimate Partner Violence: Not At Risk (10/27/2023)   Humiliation, Afraid, Rape, and Kick questionnaire    Fear of Current or Ex-Partner: No    Emotionally Abused: No    Physically Abused: No    Sexually Abused: No     Family History  Problem Relation Age of Onset   Parkinson's disease Father    Dementia Sister      Current Outpatient Medications:    famotidine (PEPCID) 40 MG tablet, Take 40 mg by mouth daily., Disp: , Rfl:    hydrocortisone 2.5 % cream, Apply topically., Disp: , Rfl:    omeprazole (PRILOSEC) 40 MG capsule, Take 40 mg by mouth daily., Disp: , Rfl:    beta carotene w/minerals (OCUVITE) tablet, Take 1 tablet by mouth daily., Disp: , Rfl:    Cholecalciferol (VITAMIN D3) 3000 units TABS, Take 2,000 Units by mouth daily., Disp: , Rfl:    citalopram (CELEXA) 20 MG tablet, Take 20 mg by mouth daily., Disp: , Rfl:    fluticasone (FLONASE) 50 MCG/ACT nasal spray, Place into the nose., Disp: , Rfl:    galantamine (RAZADYNE) 4 MG tablet, Take by mouth., Disp: , Rfl:    magnesium oxide (MAG-OX) 400 MG tablet, Take by mouth., Disp: , Rfl:    Multiple Vitamin (MULTIVITAMIN) tablet, Take 1 tablet by mouth daily., Disp: , Rfl:    olopatadine (PATANOL) 0.1 % ophthalmic solution, Place 2 drops into both eyes 2 (two) times daily., Disp: , Rfl:    omega-3 acid ethyl esters (LOVAZA) 1 g capsule, Take 1,200 mg by mouth daily., Disp: , Rfl:    pantoprazole (PROTONIX) 40 MG tablet, Take 40 mg by mouth daily., Disp: , Rfl:    polycarbophil (FIBERCON) 625 MG tablet, Take 625 mg by mouth daily., Disp: , Rfl:    pramipexole (MIRAPEX) 1 MG tablet, Take 1 mg by mouth daily., Disp: , Rfl:    rosuvastatin (CRESTOR) 5 MG tablet,  Take 5 mg by mouth daily., Disp: , Rfl:    senna-docusate (SENOKOT-S) 8.6-50 MG tablet, Take 1 tablet by mouth daily., Disp: , Rfl:    sertraline (ZOLOFT) 50 MG tablet, Take 50 mg by mouth daily., Disp: , Rfl:    Specialty Vitamins Products (MAGNESIUM, AMINO ACID CHELATE,) 133 MG tablet, Take 1 tablet by mouth 2 (two) times daily., Disp: , Rfl:    triamcinolone (NASACORT ALLERGY 24HR) 55 MCG/ACT AERO nasal inhaler, Place 2 sprays into the nose daily., Disp: , Rfl:    Physical exam:  Vitals:   10/27/23 1108  BP: 135/81  Pulse: (!) 56  Resp: 18  Temp: (!) 95.6 F (35.3 C)  TempSrc: Tympanic  SpO2: 97%  Weight: 188 lb 1.6 oz (85.3 kg)  Height: 6' (1.829 m)   Physical Exam Cardiovascular:     Rate and Rhythm: Normal rate and regular rhythm.     Heart sounds: Normal heart sounds.  Pulmonary:     Effort: Pulmonary effort is normal.     Breath sounds: Normal breath sounds.  Abdominal:     General: Bowel sounds are normal.     Palpations: Abdomen is soft.  Skin:    General: Skin is warm and dry.  Neurological:     Mental Status: He is alert and oriented to person, place, and time.           Latest Ref Rng & Units 08/18/2012    6:18 PM  CMP  Glucose 65 - 99 mg/dL 161   BUN 7 - 18 mg/dL 11   Creatinine 0.96 - 1.30 mg/dL 0.45   Sodium 409 - 811 mmol/L 136   Potassium 3.5 - 5.1 mmol/L 3.5   Chloride 98 - 107 mmol/L 102   CO2 21 - 32 mmol/L 27   Calcium 8.5 - 10.1 mg/dL 8.7   Total Protein 6.4 - 8.2 g/dL 7.4   Total Bilirubin 0.2 - 1.0 mg/dL 0.8   Alkaline Phos 50 - 136 Unit/L 81   AST 15 - 37 Unit/L 25   ALT 12 - 78 U/L 28       Latest Ref Rng & Units 08/18/2012    6:18 PM  CBC  WBC 3.8 - 10.6 x10 3/mm 3 12.1   Hemoglobin 13.0 - 18.0 g/dL 91.4   Hematocrit 78.2 - 52.0 % 45.8   Platelets 150 - 440 x10 3/mm 3 285     No images are attached to the encounter.  US Carotid Bilateral Result Date: 10/19/2023 CLINICAL DATA:  83 year old male with a history of carotid  stenosis EXAM: BILATERAL CAROTID DUPLEX ULTRASOUND TECHNIQUE: Wallace Cullens scale imaging, color Doppler and duplex ultrasound were performed of bilateral carotid and vertebral arteries in the neck. COMPARISON:  None FINDINGS: Criteria: Quantification of carotid stenosis is based on velocity parameters that correlate the residual internal carotid diameter with NASCET-based stenosis levels, using the diameter of the distal internal carotid lumen as the denominator for stenosis measurement. The following velocity measurements were obtained: RIGHT ICA:  Systolic 73 cm/sec, Diastolic 16 cm/sec CCA:  77 cm/sec SYSTOLIC ICA/CCA RATIO:  0.9 ECA:  107 cm/sec LEFT ICA:  Systolic 41 cm/sec, Diastolic 9 cm/sec CCA:  82 cm/sec SYSTOLIC ICA/CCA RATIO:  0.5 ECA:  77 cm/sec Right Brachial SBP: Not acquired Left Brachial SBP: Not acquired RIGHT CAROTID ARTERY: No significant calcifications of the right common carotid artery. Intermediate waveform maintained. Heterogeneous and partially calcified plaque at the right carotid bifurcation. No significant lumen shadowing. Low resistance waveform of the right ICA. No significant tortuosity. RIGHT VERTEBRAL ARTERY: Antegrade flow with low resistance waveform. Note that the left cervical ICA was not completely evaluated, however, evaluation was performed at the bifurcation. LEFT CAROTID ARTERY: No significant calcifications of the left common carotid artery. Intermediate waveform maintained. Heterogeneous and partially calcified plaque at the left carotid bifurcation. No significant lumen shadowing. Low resistance waveform of the proximal left ICA. Distal left cervical ICA was not sampled. LEFT VERTEBRAL ARTERY:  Antegrade flow with low resistance waveform. IMPRESSION: Color duplex indicates minimal heterogeneous and calcified plaque, with no hemodynamically significant stenosis by duplex criteria. Signed, Yvone Neu. Loreta Ave,  DO, ABVM, RPVI [facility]  Radiology Electronically Signed   By: Gilmer Mor D.O.   On: 10/19/2023 16:35    Assessment and plan- Patient is a 83 y.o. male referred for fatigue and low ferritin  Patient's baseline hemoglobin runs around 14.  In January 2025 he was noted to have a hemoglobin of 13.7 which is within normal limits.  Of note he is noted to have a low ferritin levels over the last 2 years.  Patient does note some increased in fatigue over the last month or so.  We discussed oral versus IV iron for his low ferritin which may potentially improve his fatigue.  He wishes to proceed with IV iron at this time.  Discussed risks and benefits of IV iron including all but not limited to possible risk of infusion anaphylactic reaction.  Patient understands and agrees to proceed as planned.  We will plan to give him 1 dose of Monoferric.No labs today.  CBC ferritin and iron studies in 2 months and I will see him 2 to 3 days after labs.  We are holding off on any repeat GI workup at this time given his age   Thank you for this kind referral and the opportunity to participate in the care of this patient   Visit Diagnosis 1. Low ferritin     Dr. Owens Shark, MD, MPH [facility] at [facility] [phone] 10/27/2023

## 2023-10-28 ENCOUNTER — Encounter: Admitting: Internal Medicine

## 2023-10-28 ENCOUNTER — Other Ambulatory Visit

## 2023-10-28 ENCOUNTER — Encounter: Payer: Self-pay | Admitting: Oncology

## 2023-12-22 ENCOUNTER — Inpatient Hospital Stay

## 2023-12-22 ENCOUNTER — Inpatient Hospital Stay: Attending: Oncology

## 2023-12-22 DIAGNOSIS — R790 Abnormal level of blood mineral: Secondary | ICD-10-CM | POA: Insufficient documentation

## 2023-12-22 LAB — CBC WITH DIFFERENTIAL (CANCER CENTER ONLY)
Abs Immature Granulocytes: 0.03 10*3/uL (ref 0.00–0.07)
Basophils Absolute: 0.1 10*3/uL (ref 0.0–0.1)
Basophils Relative: 1 %
Eosinophils Absolute: 0.1 10*3/uL (ref 0.0–0.5)
Eosinophils Relative: 3 %
HCT: 38.9 % — ABNORMAL LOW (ref 39.0–52.0)
Hemoglobin: 13.5 g/dL (ref 13.0–17.0)
Immature Granulocytes: 1 %
Lymphocytes Relative: 27 %
Lymphs Abs: 1.3 10*3/uL (ref 0.7–4.0)
MCH: 29.1 pg (ref 26.0–34.0)
MCHC: 34.7 g/dL (ref 30.0–36.0)
MCV: 83.8 fL (ref 80.0–100.0)
Monocytes Absolute: 0.4 10*3/uL (ref 0.1–1.0)
Monocytes Relative: 9 %
Neutro Abs: 2.8 10*3/uL (ref 1.7–7.7)
Neutrophils Relative %: 59 %
Platelet Count: 217 10*3/uL (ref 150–400)
RBC: 4.64 MIL/uL (ref 4.22–5.81)
RDW: 14.8 % (ref 11.5–15.5)
WBC Count: 4.7 10*3/uL (ref 4.0–10.5)
nRBC: 0 % (ref 0.0–0.2)

## 2023-12-22 LAB — IRON AND TIBC
Iron: 102 ug/dL (ref 45–182)
Saturation Ratios: 36 % (ref 17.9–39.5)
TIBC: 280 ug/dL (ref 250–450)
UIBC: 178 ug/dL

## 2023-12-22 LAB — FERRITIN: Ferritin: 99 ng/mL (ref 24–336)

## 2023-12-24 ENCOUNTER — Inpatient Hospital Stay: Admitting: Oncology

## 2024-01-07 ENCOUNTER — Inpatient Hospital Stay: Attending: Oncology | Admitting: Oncology

## 2024-01-07 ENCOUNTER — Encounter: Payer: Self-pay | Admitting: Oncology

## 2024-01-07 DIAGNOSIS — Z79899 Other long term (current) drug therapy: Secondary | ICD-10-CM | POA: Diagnosis not present

## 2024-01-07 DIAGNOSIS — R790 Abnormal level of blood mineral: Secondary | ICD-10-CM | POA: Diagnosis not present

## 2024-01-07 DIAGNOSIS — E611 Iron deficiency: Secondary | ICD-10-CM | POA: Insufficient documentation

## 2024-01-07 NOTE — Progress Notes (Signed)
 Hematology/Oncology Consult note [facility]  Telephone:([phone] Fax:([phone]  Patient Care Team: Sari Cunning, MD as PCP - General (Internal Medicine)   Name of the patient: Frank Gonzalez  [id number]  [birth year]   Date of visit: 01/07/24  Diagnosis-history of iron deficiency without anemia  Chief complaint/ Reason for visit-routine follow-up of iron deficiency  Heme/Onc history: patient is a 83 year old male with a past medical history significant for anxiety and GERD among other medical problems.  He is doing well for his age and is quite active.  He works part-time and is independent of his ADLs and IADLs.  His wife felt that patient was acting more fatigued as well as possibly pale and therefore had his blood work checked.He was noted to have a hemoglobin of 13.7/29.8 in January 2025.  Ferritin levels were low at 10 in March 2025 but iron studies were normal.  Of note patient has had low ferritin for the last 2 years.  He denies any blood loss in the stool or urine.  Denies any dark melanotic stools or consistent use of NSAIDs.  He has had colonoscopy over 5 years ago and does not wish to get another GI workup at this time.  Patient received 1 dose of Monoferric  in March 2025  Interval history-patient reports somewhat improvement in his energy after receiving IV iron.  He remains active and independent of his ADLs and IADLs  ECOG PS- 1 Pain scale- 0   Review of systems- Review of Systems  Constitutional:  Negative for chills, fever, malaise/fatigue and weight loss.  HENT:  Negative for congestion, ear discharge and nosebleeds.   Eyes:  Negative for blurred vision.  Respiratory:  Negative for cough, hemoptysis, sputum production, shortness of breath and wheezing.   Cardiovascular:  Negative for chest pain, palpitations, orthopnea and claudication.  Gastrointestinal:  Negative for abdominal pain, blood in stool, constipation, diarrhea,  heartburn, melena, nausea and vomiting.  Genitourinary:  Negative for dysuria, flank pain, frequency, hematuria and urgency.  Musculoskeletal:  Negative for back pain, joint pain and myalgias.  Skin:  Negative for rash.  Neurological:  Negative for dizziness, tingling, focal weakness, seizures, weakness and headaches.  Endo/Heme/Allergies:  Does not bruise/bleed easily.  Psychiatric/Behavioral:  Negative for depression and suicidal ideas. The patient does not have insomnia.       Allergies  Allergen Reactions   Ropinirole Other (See Comments)    Constipation     Past Medical History:  Diagnosis Date   Allergy    Anal fissure    Anxiety    Diverticulitis large intestine    GERD (gastroesophageal reflux disease)    Inguinal hernia    Meniere disease    Prostate cancer (HCC)    RLS (restless legs syndrome)    Schatzki's ring      Past Surgical History:  Procedure Laterality Date   ANAL FISSURE REPAIR  2004   COLONOSCOPY WITH PROPOFOL  N/A 09/25/2015   Procedure: COLONOSCOPY WITH PROPOFOL ;  Surgeon: Cassie Click, MD;  Location: [facility] ENDOSCOPY;  Service: Endoscopy;  Laterality: N/A;   HERNIA REPAIR     PROSTATECTOMY  2002    Social History   Socioeconomic History   Marital status: Married    Spouse name: Not on file   Number of children: Not on file   Years of education: Not on file   Highest education level: Not on file  Occupational History   Not on file  Tobacco Use  Smoking status: Never   Smokeless tobacco: Never  Vaping Use   Vaping status: Never Used  Substance and Sexual Activity   Alcohol use: No   Drug use: No   Sexual activity: Not Currently  Other Topics Concern   Not on file  Social History Narrative   Not on file   Social Drivers of Health   Financial Resource Strain: Low Risk  (07/13/2023)   Received from [facility]   Overall Financial Resource Strain (CARDIA)    Difficulty of Paying Living Expenses: Not hard at all   Food Insecurity: No Food Insecurity (10/27/2023)   Hunger Vital Sign    Worried About Running Out of Food in the Last Year: Never true    Ran Out of Food in the Last Year: Never true  Transportation Needs: No Transportation Needs (10/27/2023)   PRAPARE - Administrator, Civil Service (Medical): No    Lack of Transportation (Non-Medical): No  Physical Activity: Not on file  Stress: Not on file  Social Connections: Not on file  Intimate Partner Violence: Not At Risk (10/27/2023)   Humiliation, Afraid, Rape, and Kick questionnaire    Fear of Current or Ex-Partner: No    Emotionally Abused: No    Physically Abused: No    Sexually Abused: No    Family History  Problem Relation Age of Onset   Parkinson's disease Father    Dementia Sister      Current Outpatient Medications:    beta carotene w/minerals (OCUVITE) tablet, Take 1 tablet by mouth daily., Disp: , Rfl:    Cholecalciferol (VITAMIN D3) 3000 units TABS, Take 2,000 Units by mouth daily., Disp: , Rfl:    citalopram (CELEXA) 20 MG tablet, Take 20 mg by mouth daily., Disp: , Rfl:    famotidine (PEPCID) 40 MG tablet, Take 40 mg by mouth daily., Disp: , Rfl:    fluticasone (FLONASE) 50 MCG/ACT nasal spray, Place into the nose., Disp: , Rfl:    galantamine (RAZADYNE) 4 MG tablet, Take by mouth., Disp: , Rfl:    hydrocortisone 2.5 % cream, Apply topically., Disp: , Rfl:    magnesium oxide (MAG-OX) 400 MG tablet, Take by mouth., Disp: , Rfl:    Multiple Vitamin (MULTIVITAMIN) tablet, Take 1 tablet by mouth daily., Disp: , Rfl:    olopatadine (PATANOL) 0.1 % ophthalmic solution, Place 2 drops into both eyes 2 (two) times daily., Disp: , Rfl:    omega-3 acid ethyl esters (LOVAZA) 1 g capsule, Take 1,200 mg by mouth daily., Disp: , Rfl:    omeprazole (PRILOSEC) 40 MG capsule, Take 40 mg by mouth daily., Disp: , Rfl:    pantoprazole (PROTONIX) 40 MG tablet, Take 40 mg by mouth daily., Disp: , Rfl:    polycarbophil (FIBERCON) 625  MG tablet, Take 625 mg by mouth daily., Disp: , Rfl:    pramipexole (MIRAPEX) 1 MG tablet, Take 1 mg by mouth daily., Disp: , Rfl:    rosuvastatin (CRESTOR) 5 MG tablet, Take 5 mg by mouth daily., Disp: , Rfl:    senna-docusate (SENOKOT-S) 8.6-50 MG tablet, Take 1 tablet by mouth daily., Disp: , Rfl:    sertraline (ZOLOFT) 50 MG tablet, Take 50 mg by mouth daily., Disp: , Rfl:    Specialty Vitamins Products (MAGNESIUM, AMINO ACID CHELATE,) 133 MG tablet, Take 1 tablet by mouth 2 (two) times daily., Disp: , Rfl:    triamcinolone  (NASACORT ALLERGY 24HR) 55 MCG/ACT AERO nasal inhaler, Place 2 sprays  into the nose daily., Disp: , Rfl:   Physical exam:  Vitals:   01/07/24 0938  BP: 124/75  Pulse: 65  Resp: 18  Temp: 97.7 F (36.5 C)  TempSrc: Tympanic  SpO2: 96%  Weight: 180 lb (81.6 kg)  Height: 6' (1.829 m)   Physical Exam Cardiovascular:     Rate and Rhythm: Normal rate and regular rhythm.     Heart sounds: Normal heart sounds.  Pulmonary:     Effort: Pulmonary effort is normal.     Breath sounds: Normal breath sounds.  Skin:    General: Skin is warm and dry.  Neurological:     Mental Status: He is alert and oriented to person, place, and time.      I have personally reviewed labs listed below:    Latest Ref Rng & Units 08/18/2012    6:18 PM  CMP  Glucose 65 - 99 mg/dL 130   BUN 7 - 18 mg/dL 11   Creatinine 8.65 - 1.30 mg/dL 7.84   Sodium 696 - 295 mmol/L 136   Potassium 3.5 - 5.1 mmol/L 3.5   Chloride 98 - 107 mmol/L 102   CO2 21 - 32 mmol/L 27   Calcium 8.5 - 10.1 mg/dL 8.7   Total Protein 6.4 - 8.2 g/dL 7.4   Total Bilirubin 0.2 - 1.0 mg/dL 0.8   Alkaline Phos 50 - 136 Unit/L 81   AST 15 - 37 Unit/L 25   ALT 12 - 78 U/L 28       Latest Ref Rng & Units 12/22/2023    9:05 AM  CBC  WBC 4.0 - 10.5 K/uL 4.7   Hemoglobin 13.0 - 17.0 g/dL 28.4   Hematocrit 13.2 - 52.0 % 38.9   Platelets 150 - 400 K/uL 217      Assessment and plan- Patient is a 83 y.o. male  referred for low ferritin  Patient did not have any evidence of anemia although he was found to have a low ferritin of 10.  Given that he had subjective fatigue we did proceed with 1 dose of Monoferric .  Today his iron studies are within Normal limits with ferritin levels more than 50 and iron saturation of 36%.  He does not require any IV iron at this time.  CBC ferritin and iron studies in 3 and 6 months and I will see him back in 6 months.   Visit Diagnosis 1. Low ferritin      Dr. Seretha Dance, MD, MPH [facility] [phone] 01/07/2024 1:11 PM

## 2024-01-07 NOTE — Progress Notes (Signed)
 Patient is doing well, no new questions or concerns for the doctor today. He did have some medication changes, which I am working on now.

## 2024-01-10 DIAGNOSIS — E782 Mixed hyperlipidemia: Secondary | ICD-10-CM | POA: Diagnosis not present

## 2024-01-10 DIAGNOSIS — R739 Hyperglycemia, unspecified: Secondary | ICD-10-CM | POA: Diagnosis not present

## 2024-01-17 DIAGNOSIS — F330 Major depressive disorder, recurrent, mild: Secondary | ICD-10-CM | POA: Diagnosis not present

## 2024-01-17 DIAGNOSIS — R739 Hyperglycemia, unspecified: Secondary | ICD-10-CM | POA: Diagnosis not present

## 2024-01-17 DIAGNOSIS — Z125 Encounter for screening for malignant neoplasm of prostate: Secondary | ICD-10-CM | POA: Diagnosis not present

## 2024-01-17 DIAGNOSIS — F01A0 Vascular dementia, mild, without behavioral disturbance, psychotic disturbance, mood disturbance, and anxiety: Secondary | ICD-10-CM | POA: Diagnosis not present

## 2024-01-17 DIAGNOSIS — F339 Major depressive disorder, recurrent, unspecified: Secondary | ICD-10-CM | POA: Diagnosis not present

## 2024-01-17 DIAGNOSIS — Z0000 Encounter for general adult medical examination without abnormal findings: Secondary | ICD-10-CM | POA: Diagnosis not present

## 2024-01-17 DIAGNOSIS — E782 Mixed hyperlipidemia: Secondary | ICD-10-CM | POA: Diagnosis not present

## 2024-01-19 DIAGNOSIS — F01A0 Vascular dementia, mild, without behavioral disturbance, psychotic disturbance, mood disturbance, and anxiety: Secondary | ICD-10-CM | POA: Diagnosis not present

## 2024-01-19 DIAGNOSIS — R2689 Other abnormalities of gait and mobility: Secondary | ICD-10-CM | POA: Diagnosis not present

## 2024-01-19 DIAGNOSIS — G2581 Restless legs syndrome: Secondary | ICD-10-CM | POA: Diagnosis not present

## 2024-04-25 ENCOUNTER — Inpatient Hospital Stay: Attending: Oncology

## 2024-04-25 DIAGNOSIS — D509 Iron deficiency anemia, unspecified: Secondary | ICD-10-CM | POA: Diagnosis not present

## 2024-04-25 DIAGNOSIS — R790 Abnormal level of blood mineral: Secondary | ICD-10-CM

## 2024-04-25 LAB — CBC WITH DIFFERENTIAL (CANCER CENTER ONLY)
Abs Immature Granulocytes: 0.02 K/uL (ref 0.00–0.07)
Basophils Absolute: 0.1 K/uL (ref 0.0–0.1)
Basophils Relative: 1 %
Eosinophils Absolute: 0.2 K/uL (ref 0.0–0.5)
Eosinophils Relative: 3 %
HCT: 40.5 % (ref 39.0–52.0)
Hemoglobin: 13.9 g/dL (ref 13.0–17.0)
Immature Granulocytes: 0 %
Lymphocytes Relative: 26 %
Lymphs Abs: 1.4 K/uL (ref 0.7–4.0)
MCH: 29.9 pg (ref 26.0–34.0)
MCHC: 34.3 g/dL (ref 30.0–36.0)
MCV: 87.1 fL (ref 80.0–100.0)
Monocytes Absolute: 0.4 K/uL (ref 0.1–1.0)
Monocytes Relative: 8 %
Neutro Abs: 3.3 K/uL (ref 1.7–7.7)
Neutrophils Relative %: 62 %
Platelet Count: 242 K/uL (ref 150–400)
RBC: 4.65 MIL/uL (ref 4.22–5.81)
RDW: 12.7 % (ref 11.5–15.5)
WBC Count: 5.3 K/uL (ref 4.0–10.5)
nRBC: 0 % (ref 0.0–0.2)

## 2024-04-25 LAB — FERRITIN: Ferritin: 52 ng/mL (ref 24–336)

## 2024-04-25 LAB — IRON AND TIBC
Iron: 89 ug/dL (ref 45–182)
Saturation Ratios: 29 % (ref 17.9–39.5)
TIBC: 312 ug/dL (ref 250–450)
UIBC: 223 ug/dL

## 2024-05-02 DIAGNOSIS — H6123 Impacted cerumen, bilateral: Secondary | ICD-10-CM | POA: Diagnosis not present

## 2024-05-02 DIAGNOSIS — H8103 Meniere's disease, bilateral: Secondary | ICD-10-CM | POA: Diagnosis not present

## 2024-05-02 DIAGNOSIS — J301 Allergic rhinitis due to pollen: Secondary | ICD-10-CM | POA: Diagnosis not present

## 2024-05-17 DIAGNOSIS — G309 Alzheimer's disease, unspecified: Secondary | ICD-10-CM | POA: Diagnosis not present

## 2024-05-17 DIAGNOSIS — G939 Disorder of brain, unspecified: Secondary | ICD-10-CM | POA: Diagnosis not present

## 2024-05-17 DIAGNOSIS — F0150 Vascular dementia without behavioral disturbance: Secondary | ICD-10-CM | POA: Diagnosis not present

## 2024-05-17 DIAGNOSIS — F0280 Dementia in other diseases classified elsewhere without behavioral disturbance: Secondary | ICD-10-CM | POA: Diagnosis not present

## 2024-05-17 DIAGNOSIS — G2581 Restless legs syndrome: Secondary | ICD-10-CM | POA: Diagnosis not present

## 2024-05-17 DIAGNOSIS — Z8673 Personal history of transient ischemic attack (TIA), and cerebral infarction without residual deficits: Secondary | ICD-10-CM | POA: Diagnosis not present

## 2024-05-17 DIAGNOSIS — K5900 Constipation, unspecified: Secondary | ICD-10-CM | POA: Diagnosis not present

## 2024-05-23 DIAGNOSIS — D2271 Melanocytic nevi of right lower limb, including hip: Secondary | ICD-10-CM | POA: Diagnosis not present

## 2024-05-23 DIAGNOSIS — D2261 Melanocytic nevi of right upper limb, including shoulder: Secondary | ICD-10-CM | POA: Diagnosis not present

## 2024-05-23 DIAGNOSIS — L821 Other seborrheic keratosis: Secondary | ICD-10-CM | POA: Diagnosis not present

## 2024-05-23 DIAGNOSIS — L570 Actinic keratosis: Secondary | ICD-10-CM | POA: Diagnosis not present

## 2024-05-23 DIAGNOSIS — D2262 Melanocytic nevi of left upper limb, including shoulder: Secondary | ICD-10-CM | POA: Diagnosis not present

## 2024-05-23 DIAGNOSIS — D2272 Melanocytic nevi of left lower limb, including hip: Secondary | ICD-10-CM | POA: Diagnosis not present

## 2024-05-23 DIAGNOSIS — D225 Melanocytic nevi of trunk: Secondary | ICD-10-CM | POA: Diagnosis not present

## 2024-08-09 ENCOUNTER — Other Ambulatory Visit

## 2024-08-09 ENCOUNTER — Ambulatory Visit: Admitting: Oncology
